# Patient Record
Sex: Male | Born: 1990 | Race: White | Hispanic: No | Marital: Married | Smoking: Former smoker
Health system: Southern US, Community
[De-identification: ages and names within clinical notes are randomized; demographics above are authoritative.]

## PROBLEM LIST (undated history)

## (undated) DIAGNOSIS — I10 Essential (primary) hypertension: Secondary | ICD-10-CM

## (undated) DIAGNOSIS — E785 Hyperlipidemia, unspecified: Secondary | ICD-10-CM

## (undated) HISTORY — DX: Essential (primary) hypertension: I10

## (undated) HISTORY — DX: Hyperlipidemia, unspecified: E78.5

---

## 2017-02-15 ENCOUNTER — Ambulatory Visit (INDEPENDENT_AMBULATORY_CARE_PROVIDER_SITE_OTHER): Payer: 59 | Admitting: Family Medicine

## 2017-02-15 ENCOUNTER — Encounter: Payer: Self-pay | Admitting: Family Medicine

## 2017-02-15 VITALS — BP 126/70 | HR 81 | Resp 12 | Ht 68.0 in | Wt 273.2 lb

## 2017-02-15 DIAGNOSIS — E781 Pure hyperglyceridemia: Secondary | ICD-10-CM

## 2017-02-15 DIAGNOSIS — I10 Essential (primary) hypertension: Secondary | ICD-10-CM

## 2017-02-15 DIAGNOSIS — Z6841 Body Mass Index (BMI) 40.0 and over, adult: Secondary | ICD-10-CM | POA: Diagnosis not present

## 2017-02-15 DIAGNOSIS — Z23 Encounter for immunization: Secondary | ICD-10-CM | POA: Diagnosis not present

## 2017-02-15 NOTE — Patient Instructions (Addendum)
A few things to remember from today's visit:   Hypertension, essential, benign - Plan: losartan (COZAAR) 100 MG tablet  Hypertriglyceridemia   DASH Eating Plan DASH stands for "Dietary Approaches to Stop Hypertension." The DASH eating plan is a healthy eating plan that has been shown to reduce high blood pressure (hypertension). It may also reduce your risk for type 2 diabetes, heart disease, and stroke. The DASH eating plan may also help with weight loss. What are tips for following this plan? General guidelines  Avoid eating more than 2,300 mg (milligrams) of salt (sodium) a day. If you have hypertension, you may need to reduce your sodium intake to 1,500 mg a day.  Limit alcohol intake to no more than 1 drink a day for nonpregnant women and 2 drinks a day for men. One drink equals 12 oz of beer, 5 oz of wine, or 1 oz of hard liquor.  Work with your health care provider to maintain a healthy body weight or to lose weight. Ask what an ideal weight is for you.  Get at least 30 minutes of exercise that causes your heart to beat faster (aerobic exercise) most days of the week. Activities may include walking, swimming, or biking.  Work with your health care provider or diet and nutrition specialist (dietitian) to adjust your eating plan to your individual calorie needs. Reading food labels  Check food labels for the amount of sodium per serving. Choose foods with less than 5 percent of the Daily Value of sodium. Generally, foods with less than 300 mg of sodium per serving fit into this eating plan.  To find whole grains, look for the word "whole" as the first word in the ingredient list. Shopping  Buy products labeled as "low-sodium" or "no salt added."  Buy fresh foods. Avoid canned foods and premade or frozen meals. Cooking  Avoid adding salt when cooking. Use salt-free seasonings or herbs instead of table salt or sea salt. Check with your health care provider or pharmacist before  using salt substitutes.  Do not fry foods. Cook foods using healthy methods such as baking, boiling, grilling, and broiling instead.  Cook with heart-healthy oils, such as olive, canola, soybean, or sunflower oil. Meal planning   Eat a balanced diet that includes: ? 5 or more servings of fruits and vegetables each day. At each meal, try to fill half of your plate with fruits and vegetables. ? Up to 6-8 servings of whole grains each day. ? Less than 6 oz of lean meat, poultry, or fish each day. A 3-oz serving of meat is about the same size as a deck of cards. One egg equals 1 oz. ? 2 servings of low-fat dairy each day. ? A serving of nuts, seeds, or beans 5 times each week. ? Heart-healthy fats. Healthy fats called Omega-3 fatty acids are found in foods such as flaxseeds and coldwater fish, like sardines, salmon, and mackerel.  Limit how much you eat of the following: ? Canned or prepackaged foods. ? Food that is high in trans fat, such as fried foods. ? Food that is high in saturated fat, such as fatty meat. ? Sweets, desserts, sugary drinks, and other foods with added sugar. ? Full-fat dairy products.  Do not salt foods before eating.  Try to eat at least 2 vegetarian meals each week.  Eat more home-cooked food and less restaurant, buffet, and fast food.  When eating at a restaurant, ask that your food be prepared with less salt  or no salt, if possible. What foods are recommended? The items listed may not be a complete list. Talk with your dietitian about what dietary choices are best for you. Grains Whole-grain or whole-wheat bread. Whole-grain or whole-wheat pasta. Brown rice. Orpah Cobb. Bulgur. Whole-grain and low-sodium cereals. Pita bread. Low-fat, low-sodium crackers. Whole-wheat flour tortillas. Vegetables Fresh or frozen vegetables (raw, steamed, roasted, or grilled). Low-sodium or reduced-sodium tomato and vegetable juice. Low-sodium or reduced-sodium tomato sauce  and tomato paste. Low-sodium or reduced-sodium canned vegetables. Fruits All fresh, dried, or frozen fruit. Canned fruit in natural juice (without added sugar). Meat and other protein foods Skinless chicken or [place]. Ground chicken or [place]. Pork with fat trimmed off. Fish and seafood. Egg whites. Dried beans, peas, or lentils. Unsalted nuts, nut butters, and seeds. Unsalted canned beans. Lean cuts of beef with fat trimmed off. Low-sodium, lean deli meat. Dairy Low-fat (1%) or fat-free (skim) milk. Fat-free, low-fat, or reduced-fat cheeses. Nonfat, low-sodium ricotta or cottage cheese. Low-fat or nonfat yogurt. Low-fat, low-sodium cheese. Fats and oils Soft margarine without trans fats. Vegetable oil. Low-fat, reduced-fat, or light mayonnaise and salad dressings (reduced-sodium). Canola, safflower, olive, soybean, and sunflower oils. Avocado. Seasoning and other foods Herbs. Spices. Seasoning mixes without salt. Unsalted popcorn and pretzels. Fat-free sweets. What foods are not recommended? The items listed may not be a complete list. Talk with your dietitian about what dietary choices are best for you. Grains Baked goods made with fat, such as croissants, muffins, or some breads. Dry pasta or rice meal packs. Vegetables Creamed or fried vegetables. Vegetables in a cheese sauce. Regular canned vegetables (not low-sodium or reduced-sodium). Regular canned tomato sauce and paste (not low-sodium or reduced-sodium). Regular tomato and vegetable juice (not low-sodium or reduced-sodium). Rosita Fire. Olives. Fruits Canned fruit in a light or heavy syrup. Fried fruit. Fruit in cream or butter sauce. Meat and other protein foods Fatty cuts of meat. Ribs. Fried meat. Tomasa Blase. Sausage. Bologna and other processed lunch meats. Salami. Fatback. Hotdogs. Bratwurst. Salted nuts and seeds. Canned beans with added salt. Canned or smoked fish. Whole eggs or egg yolks. Chicken or [place] with skin. Dairy Whole or 2%  milk, cream, and half-and-half. Whole or full-fat cream cheese. Whole-fat or sweetened yogurt. Full-fat cheese. Nondairy creamers. Whipped toppings. Processed cheese and cheese spreads. Fats and oils Butter. Stick margarine. Lard. Shortening. Ghee. Bacon fat. Tropical oils, such as coconut, palm kernel, or palm oil. Seasoning and other foods Salted popcorn and pretzels. Onion salt, garlic salt, seasoned salt, table salt, and sea salt. Worcestershire sauce. Tartar sauce. Barbecue sauce. Teriyaki sauce. Soy sauce, including reduced-sodium. Steak sauce. Canned and packaged gravies. Fish sauce. Oyster sauce. Cocktail sauce. Horseradish that you find on the shelf. Ketchup. Mustard. Meat flavorings and tenderizers. Bouillon cubes. Hot sauce and Tabasco sauce. Premade or packaged marinades. Premade or packaged taco seasonings. Relishes. Regular salad dressings. Where to find more information:  National Heart, Lung, and Blood Institute: PopSteam.is  American Heart Association: www.heart.org Summary  The DASH eating plan is a healthy eating plan that has been shown to reduce high blood pressure (hypertension). It may also reduce your risk for type 2 diabetes, heart disease, and stroke.  With the DASH eating plan, you should limit salt (sodium) intake to 2,300 mg a day. If you have hypertension, you may need to reduce your sodium intake to 1,500 mg a day.  When on the DASH eating plan, aim to eat more fresh fruits and vegetables, whole grains, lean proteins, low-fat dairy,  and heart-healthy fats.  Work with your health care provider or diet and nutrition specialist (dietitian) to adjust your eating plan to your individual calorie needs. This information is not intended to replace advice given to you by your health care provider. Make sure you discuss any questions you have with your health care provider. Document Released: 04/08/2011 Document Revised: 04/12/2016 Document Reviewed:  04/12/2016 Elsevier Interactive Patient Education  2017 [employer].  Please be sure medication list is accurate. If a new problem present, please set up appointment sooner than planned today.

## 2017-02-15 NOTE — Progress Notes (Signed)
HPI:   Brad Hart is a 26 y.o. male, who is here today to establish care.  Former PCP: Dr Burnett Sheng Last preventive routine visit: 09/01/16  Chronic medical problems: HTN,HLD,obesity   HLD:  FLP in 08/2016: TC 196, TG 227,HDL 42.6, and LDL 108. He is not on pharmacologic treatment.  He has not been consistent with a low fat diet. He drinks a couple beers per week. Former smoker.  He does exercise regularly but acknowledges that his problem is his diet.  TSH 1.219 HgA1C 4.9   HTN: Dx early this year. He is on Losartan 100 mg daily. Denies severe/frequent headache, visual changes, chest pain, dyspnea, palpitation, claudication, focal weakness, or edema.  He does not follow a low salt diet. 08/2016: Cr 1.2, e GFR 73, K+ 3.9.  LFT normal.    Review of Systems  Constitutional: Negative for activity change, appetite change, fatigue, fever and unexpected weight change.  HENT: Negative for nosebleeds, sore throat and trouble swallowing.   Eyes: Negative for redness and visual disturbance.  Respiratory: Negative for apnea, cough, shortness of breath and wheezing.   Cardiovascular: Negative for chest pain, palpitations and leg swelling.  Gastrointestinal: Negative for abdominal pain, nausea and vomiting.  Endocrine: Negative for cold intolerance, heat intolerance, polydipsia, polyphagia and polyuria.  Genitourinary: Negative for decreased urine volume, dysuria and hematuria.  Musculoskeletal: Negative for gait problem and myalgias.  Skin: Negative for pallor and rash.  Neurological: Negative for dizziness, seizures, weakness, numbness and headaches.  Psychiatric/Behavioral: Negative for confusion. The patient is not nervous/anxious.       No current outpatient prescriptions on file prior to visit.   No current facility-administered medications on file prior to visit.      Past Medical History:  Diagnosis Date  . Hyperlipidemia   . Hypertension     No Known Allergies  Family History  Problem Relation Age of Onset  . Adopted: Yes  . Hemachromatosis Maternal Grandmother   . Heart disease Maternal Grandfather        CAD/MI    Social History   Social History  . Marital status: Married    Spouse name: N/A  . Number of children: N/A  . Years of education: N/A   Social History Main Topics  . Smoking status: Former Smoker    Quit date: 09/15/2016  . Smokeless tobacco: Never Used  . Alcohol use Yes     Comment: OCCASIONAL  . Drug use: No  . Sexual activity: Yes   Other Topics Concern  . None   Social History Narrative  . None    Vitals:   02/15/17 1509  BP: 126/70  Pulse: 81  Resp: 12  SpO2: 98%    Body mass index is 41.55 kg/m.   Physical Exam  Nursing note and vitals reviewed. Constitutional: He is oriented to person, place, and time. He appears well-developed. No distress.  HENT:  Head: Normocephalic and atraumatic.  Mouth/Throat: Oropharynx is clear and moist and mucous membranes are normal.  Eyes: Pupils are equal, round, and reactive to light. Conjunctivae are normal.  Neck: No tracheal deviation present. No thyroid mass and no thyromegaly present.  Cardiovascular: Normal rate and regular rhythm.   No murmur heard. Pulses:      Dorsalis pedis pulses are 2+ on the right side, and 2+ on the left side.  Respiratory: Effort normal and breath sounds normal. No respiratory distress.  GI: Soft. He exhibits no mass. There is no  hepatomegaly. There is no tenderness.  Musculoskeletal: He exhibits no edema.  Lymphadenopathy:    He has no cervical adenopathy.  Neurological: He is alert and oriented to person, place, and time. He has normal strength. Gait normal.  Skin: Skin is warm. No rash noted. No erythema.  Psychiatric: He has a normal mood and affect. Cognition and memory are normal.  Well groomed, good eye contact.    ASSESSMENT AND PLAN:  Mr. Nelvin was seen today for establish care.  Diagnoses  and all orders for this visit:  Hypertension, essential, benign  Adequately controlled. No changes in current management. DASH diet recommended. Eye exam recommended annually. F/U in 6 months, before if needed.  Hypertriglyceridemia  Mild. Low fat diet discussed and recommended. Decrease beer intake. F/U in 08/2017.  Morbid obesity with BMI of 40.0-44.9, adult (HCC)  We discussed benefits of wt loss as well as adverse effects of obesity. Consistency with healthy diet and physical activity recommended.  Need for influenza vaccination -     Flu Vaccine QUAD 36+ mos IM        Betty G. [place], MD  [facility]. [place] office.

## 2017-02-20 ENCOUNTER — Encounter: Payer: Self-pay | Admitting: Family Medicine

## 2017-03-29 ENCOUNTER — Telehealth: Payer: Self-pay | Admitting: Family Medicine

## 2017-03-29 DIAGNOSIS — I10 Essential (primary) hypertension: Secondary | ICD-10-CM

## 2017-03-29 MED ORDER — LOSARTAN POTASSIUM 100 MG PO TABS: 100.0000 mg | ORAL_TABLET | Freq: Every day | ORAL | 0 refills | Status: DC

## 2017-03-29 NOTE — Telephone Encounter (Signed)
Copied from CRM #[id number]. Topic: General - Other >> Mar 29, 2017 11:55 AM Cecelia ByarsGreen, Temeka L, RMA wrote: Reason for CRM: patient is requesting refill for losartan 100 mg to be sent to [facility]

## 2017-03-30 MED FILL — LOSARTAN POTASSIUM 100 MG T: 100 | 90 days supply | Qty: 90 | Fill #0

## 2017-07-24 NOTE — Progress Notes (Signed)
HPI:   Brad Hart is a 27 y.o. male, who is here today for 5 months follow up.   He was last seen in 01/2017.  Hypertension:   Currently on Losartan 50 mg daily.  Home BP's 160's/50-60's.  Last eye exam: never. He is taking medications as instructed, no side effects reported.  He has not noted unusual headache, visual changes, exertional chest pain, dyspnea,  focal weakness, or edema.   Hyperlipidemia:  Currently on non pharmacologic treatment. Following a low fat diet: Not consistently. Last FLP in 08/2016, TG 227.     He has not been consistent with a healthy diet and is not exercising. He has noted wt gain.     Review of Systems  Constitutional: Negative for activity change, appetite change, fatigue and fever.  HENT: Negative for nosebleeds, sore throat and trouble swallowing.   Eyes: Negative for redness and visual disturbance.  Respiratory: Negative for cough, shortness of breath and wheezing.   Cardiovascular: Negative for chest pain, palpitations and leg swelling.  Gastrointestinal: Negative for abdominal pain, nausea and vomiting.  Endocrine: Negative for polydipsia, polyphagia and polyuria.  Genitourinary: Negative for decreased urine volume, dysuria and hematuria.  Skin: Negative for rash.  Neurological: Negative for syncope, weakness and headaches.      No current outpatient medications on file prior to visit.   No current facility-administered medications on file prior to visit.      Past Medical History:  Diagnosis Date  . Hyperlipidemia   . Hypertension    No Known Allergies  Social History   Socioeconomic History  . Marital status: Married    Spouse name: Not on file  . Number of children: Not on file  . Years of education: Not on file  . Highest education level: Not on file  Occupational History  . Not on file  Social Needs  . Financial resource strain: Not on file  . Food insecurity:    Worry: Not on file   Inability: Not on file  . Transportation needs:    Medical: Not on file    Non-medical: Not on file  Tobacco Use  . Smoking status: Former Smoker    Last attempt to quit: 09/15/2016    Years since quitting: 0.8  . Smokeless tobacco: Never Used  Substance and Sexual Activity  . Alcohol use: Yes    Comment: OCCASIONAL  . Drug use: No  . Sexual activity: Yes  Lifestyle  . Physical activity:    Days per week: Not on file    Minutes per session: Not on file  . Stress: Not on file  Relationships  . Social connections:    Talks on phone: Not on file    Gets together: Not on file    Attends religious service: Not on file    Active member of club or organization: Not on file    Attends meetings of clubs or organizations: Not on file    Relationship status: Not on file  Other Topics Concern  . Not on file  Social History Narrative  . Not on file    Vitals:   07/25/17 1453 07/25/17 1508  BP: 130/83 (!) 160/85  Pulse: 96   Resp: 12   Temp: 98.3 F (36.8 C)   SpO2: 97%    Body mass index is 42.76 kg/m.  Wt Readings from Last 3 Encounters:  07/25/17 281 lb 4 oz (127.6 kg)  02/15/17 273 lb 4 oz (123.9 kg)  Physical Exam  Nursing note and vitals reviewed. Constitutional: He is oriented to person, place, and time. He appears well-developed. No distress.  HENT:  Head: Normocephalic and atraumatic.  Mouth/Throat: Oropharynx is clear and moist and mucous membranes are normal.  Eyes: Pupils are equal, round, and reactive to light. Conjunctivae and EOM are normal.  Cardiovascular: Normal rate and regular rhythm.  No murmur heard. Pulses:      Dorsalis pedis pulses are 2+ on the right side, and 2+ on the left side.  Respiratory: Effort normal and breath sounds normal. No respiratory distress.  GI: Soft. He exhibits no mass. There is no hepatomegaly. There is no tenderness.  Musculoskeletal: He exhibits no edema.  Lymphadenopathy:    He has no cervical adenopathy.    Neurological: He is alert and oriented to person, place, and time. He has normal strength. Gait normal.  Skin: Skin is warm. No erythema.  Psychiatric: He has a normal mood and affect. Cognition and memory are normal.  Well groomed, good eye contact.      ASSESSMENT AND PLAN:   Mr. Brad Hart was seen today for 5 months follow-up.  Orders Placed This Encounter  Procedures  . Basic metabolic panel  . Lipid panel    Hypertension, essential, benign Not well controlled. Possible complications of elevated BP discussed. HCTZ 25 mg added today. No changes in Losartan 100 mg daily. Annual eye examination. BP check in 4 weeks and f/u in 3-4 months.   Hypertriglyceridemia Nonpharmacologic treatment recommended for now. He will be back in 4 weeks for fasting labs, recommendations will be given accordingly.  Morbid obesity with BMI of 40.0-44.9, adult (HCC) Gained about 8 Lb since his last visit in 01/2017. We discussed benefits of wt loss as well as adverse effects of obesity. Consistency with healthy diet and physical activity recommended.       -Mr. Brad Hart was advised to return sooner than planned today if new concerns arise.       Betty G. [place], MD  [facility]. [place] office.

## 2017-07-25 ENCOUNTER — Encounter: Payer: Self-pay | Admitting: Family Medicine

## 2017-07-25 ENCOUNTER — Ambulatory Visit (INDEPENDENT_AMBULATORY_CARE_PROVIDER_SITE_OTHER): Payer: 59 | Admitting: Family Medicine

## 2017-07-25 VITALS — BP 160/85 | HR 96 | Temp 98.3°F | Resp 12 | Ht 68.0 in | Wt 281.2 lb

## 2017-07-25 DIAGNOSIS — I10 Essential (primary) hypertension: Secondary | ICD-10-CM | POA: Diagnosis not present

## 2017-07-25 DIAGNOSIS — E781 Pure hyperglyceridemia: Secondary | ICD-10-CM

## 2017-07-25 DIAGNOSIS — Z6841 Body Mass Index (BMI) 40.0 and over, adult: Secondary | ICD-10-CM | POA: Diagnosis not present

## 2017-07-25 MED ORDER — LOSARTAN POTASSIUM 100 MG PO TABS: 100.0000 mg | ORAL_TABLET | Freq: Every day | ORAL | 1 refills | Status: DC

## 2017-07-25 MED ORDER — HYDROCHLOROTHIAZIDE 25 MG PO TABS: 25.0000 mg | ORAL_TABLET | Freq: Every day | ORAL | 0 refills | Status: DC

## 2017-07-25 MED FILL — HYDROCHLOROTHIAZIDE 25 MG T: 25 | 90 days supply | Qty: 90 | Fill #0

## 2017-07-25 MED FILL — LOSARTAN POTASSIUM 100 MG T: 100 | 90 days supply | Qty: 90 | Fill #0

## 2017-07-25 NOTE — Assessment & Plan Note (Signed)
Gained about 8 Lb since his last visit in 01/2017. We discussed benefits of wt loss as well as adverse effects of obesity. Consistency with healthy diet and physical activity recommended.

## 2017-07-25 NOTE — Patient Instructions (Addendum)
A few things to remember from today's visit:   Hypertension, essential, benign - Plan: hydrochlorothiazide (HYDRODIURIL) 25 MG tablet, Basic metabolic panel, losartan (COZAAR) 100 MG tablet  Hypertriglyceridemia - Plan: Lipid panel  DASH Eating Plan DASH stands for "Dietary Approaches to Stop Hypertension." The DASH eating plan is a healthy eating plan that has been shown to reduce high blood pressure (hypertension). It may also reduce your risk for type 2 diabetes, heart disease, and stroke. The DASH eating plan may also help with weight loss. What are tips for following this plan? General guidelines  Avoid eating more than 2,300 mg (milligrams) of salt (sodium) a day. If you have hypertension, you may need to reduce your sodium intake to 1,500 mg a day.  Limit alcohol intake to no more than 1 drink a day for nonpregnant women and 2 drinks a day for men. One drink equals 12 oz of beer, 5 oz of wine, or 1 oz of hard liquor.  Work with your health care provider to maintain a healthy body weight or to lose weight. Ask what an ideal weight is for you.  Get at least 30 minutes of exercise that causes your heart to beat faster (aerobic exercise) most days of the week. Activities may include walking, swimming, or biking.  Work with your health care provider or diet and nutrition specialist (dietitian) to adjust your eating plan to your individual calorie needs. Reading food labels  Check food labels for the amount of sodium per serving. Choose foods with less than 5 percent of the Daily Value of sodium. Generally, foods with less than 300 mg of sodium per serving fit into this eating plan.  To find whole grains, look for the word "whole" as the first word in the ingredient list. Shopping  Buy products labeled as "low-sodium" or "no salt added."  Buy fresh foods. Avoid canned foods and premade or frozen meals. Cooking  Avoid adding salt when cooking. Use salt-free seasonings or herbs  instead of table salt or sea salt. Check with your health care provider or pharmacist before using salt substitutes.  Do not fry foods. Cook foods using healthy methods such as baking, boiling, grilling, and broiling instead.  Cook with heart-healthy oils, such as olive, canola, soybean, or sunflower oil. Meal planning   Eat a balanced diet that includes: ? 5 or more servings of fruits and vegetables each day. At each meal, try to fill half of your plate with fruits and vegetables. ? Up to 6-8 servings of whole grains each day. ? Less than 6 oz of lean meat, poultry, or fish each day. A 3-oz serving of meat is about the same size as a deck of cards. One egg equals 1 oz. ? 2 servings of low-fat dairy each day. ? A serving of nuts, seeds, or beans 5 times each week. ? Heart-healthy fats. Healthy fats called Omega-3 fatty acids are found in foods such as flaxseeds and coldwater fish, like sardines, salmon, and mackerel.  Limit how much you eat of the following: ? Canned or prepackaged foods. ? Food that is high in trans fat, such as fried foods. ? Food that is high in saturated fat, such as fatty meat. ? Sweets, desserts, sugary drinks, and other foods with added sugar. ? Full-fat dairy products.  Do not salt foods before eating.  Try to eat at least 2 vegetarian meals each week.  Eat more home-cooked food and less restaurant, buffet, and fast food.  When eating at  a restaurant, ask that your food be prepared with less salt or no salt, if possible. What foods are recommended? The items listed may not be a complete list. Talk with your dietitian about what dietary choices are best for you. Grains Whole-grain or whole-wheat bread. Whole-grain or whole-wheat pasta. Brown rice. Orpah Cobbatmeal. Quinoa. Bulgur. Whole-grain and low-sodium cereals. Pita bread. Low-fat, low-sodium crackers. Whole-wheat flour tortillas. Vegetables Fresh or frozen vegetables (raw, steamed, roasted, or grilled).  Low-sodium or reduced-sodium tomato and vegetable juice. Low-sodium or reduced-sodium tomato sauce and tomato paste. Low-sodium or reduced-sodium canned vegetables. Fruits All fresh, dried, or frozen fruit. Canned fruit in natural juice (without added sugar). Meat and other protein foods Skinless chicken or Malawiturkey. Ground chicken or Malawiturkey. Pork with fat trimmed off. Fish and seafood. Egg whites. Dried beans, peas, or lentils. Unsalted nuts, nut butters, and seeds. Unsalted canned beans. Lean cuts of beef with fat trimmed off. Low-sodium, lean deli meat. Dairy Low-fat (1%) or fat-free (skim) milk. Fat-free, low-fat, or reduced-fat cheeses. Nonfat, low-sodium ricotta or cottage cheese. Low-fat or nonfat yogurt. Low-fat, low-sodium cheese. Fats and oils Soft margarine without trans fats. Vegetable oil. Low-fat, reduced-fat, or light mayonnaise and salad dressings (reduced-sodium). Canola, safflower, olive, soybean, and sunflower oils. Avocado. Seasoning and other foods Herbs. Spices. Seasoning mixes without salt. Unsalted popcorn and pretzels. Fat-free sweets. What foods are not recommended? The items listed may not be a complete list. Talk with your dietitian about what dietary choices are best for you. Grains Baked goods made with fat, such as croissants, muffins, or some breads. Dry pasta or rice meal packs. Vegetables Creamed or fried vegetables. Vegetables in a cheese sauce. Regular canned vegetables (not low-sodium or reduced-sodium). Regular canned tomato sauce and paste (not low-sodium or reduced-sodium). Regular tomato and vegetable juice (not low-sodium or reduced-sodium). Rosita FirePickles. Olives. Fruits Canned fruit in a light or heavy syrup. Fried fruit. Fruit in cream or butter sauce. Meat and other protein foods Fatty cuts of meat. Ribs. Fried meat. Tomasa BlaseBacon. Sausage. Bologna and other processed lunch meats. Salami. Fatback. Hotdogs. Bratwurst. Salted nuts and seeds. Canned beans with added  salt. Canned or smoked fish. Whole eggs or egg yolks. Chicken or Malawiturkey with skin. Dairy Whole or 2% milk, cream, and half-and-half. Whole or full-fat cream cheese. Whole-fat or sweetened yogurt. Full-fat cheese. Nondairy creamers. Whipped toppings. Processed cheese and cheese spreads. Fats and oils Butter. Stick margarine. Lard. Shortening. Ghee. Bacon fat. Tropical oils, such as coconut, palm kernel, or palm oil. Seasoning and other foods Salted popcorn and pretzels. Onion salt, garlic salt, seasoned salt, table salt, and sea salt. Worcestershire sauce. Tartar sauce. Barbecue sauce. Teriyaki sauce. Soy sauce, including reduced-sodium. Steak sauce. Canned and packaged gravies. Fish sauce. Oyster sauce. Cocktail sauce. Horseradish that you find on the shelf. Ketchup. Mustard. Meat flavorings and tenderizers. Bouillon cubes. Hot sauce and Tabasco sauce. Premade or packaged marinades. Premade or packaged taco seasonings. Relishes. Regular salad dressings. Where to find more information:  National Heart, Lung, and Blood Institute: PopSteam.iswww.nhlbi.nih.gov  American Heart Association: www.heart.org Summary  The DASH eating plan is a healthy eating plan that has been shown to reduce high blood pressure (hypertension). It may also reduce your risk for type 2 diabetes, heart disease, and stroke.  With the DASH eating plan, you should limit salt (sodium) intake to 2,300 mg a day. If you have hypertension, you may need to reduce your sodium intake to 1,500 mg a day.  When on the DASH eating plan, aim to eat  more fresh fruits and vegetables, whole grains, lean proteins, low-fat dairy, and heart-healthy fats.  Work with your health care provider or diet and nutrition specialist (dietitian) to adjust your eating plan to your individual calorie needs. This information is not intended to replace advice given to you by your health care provider. Make sure you discuss any questions you have with your health care  provider. Document Released: 04/08/2011 Document Revised: 04/12/2016 Document Reviewed: 04/12/2016 Elsevier Interactive Patient Education  [employer].   Please be sure medication list is accurate. If a new problem present, please set up appointment sooner than planned today.

## 2017-07-25 NOTE — Assessment & Plan Note (Signed)
Not well controlled. Possible complications of elevated BP discussed. HCTZ 25 mg added today. No changes in Losartan 100 mg daily. Annual eye examination. BP check in 4 weeks and f/u in 3-4 months.

## 2017-07-25 NOTE — Assessment & Plan Note (Signed)
Nonpharmacologic treatment recommended for now. He will be back in 4 weeks for fasting labs, recommendations will be given accordingly.

## 2017-08-21 NOTE — Progress Notes (Signed)
Brad Hart is a 27 y.o.male, who is here today to follow on HTN.  Currently she is on Losartan 100 mg and HCTZ 25 mg, the latter one added last OV (07/25/17). He is taking medications as instructed, no side effects reported.  He has not noted unusual headache, visual changes, exertional chest pain, dyspnea,  focal weakness, or edema.  Home BP's 130's/80's.  He has also changed his diet.   Back pain: Last week he was lifting a heavy speaker that he thinks he pulled a muscle on his back, pain started next day. Achy pain, 6/10, constant. Exacerbated by stretching uncertain movements and also when laying in bed on his back. No numbness or tingling. No saddle anesthesia,bowel or urine incontinence.  No prior history of back pain. He has not try OTC medication.  HLD: Elevated TG. He has not been consistent with low fat diet.  Cholesterol, Total   196  Triglyceride   227  HDL  42.6   LDL  108     Review of Systems  Constitutional: Negative for activity change, appetite change, fatigue and fever.  HENT: Negative for nosebleeds, sore throat and trouble swallowing.   Eyes: Negative for redness and visual disturbance.  Respiratory: Negative for cough, shortness of breath and wheezing.   Cardiovascular: Negative for chest pain, palpitations and leg swelling.  Gastrointestinal: Negative for abdominal pain, nausea and vomiting.       No changes in bowel habits.  Genitourinary: Negative for decreased urine volume and hematuria.  Musculoskeletal: Positive for back pain. Negative for gait problem.  Skin: Negative for rash.  Neurological: Negative for syncope, weakness, numbness and headaches.     Current Outpatient Medications on File Prior to Visit  Medication Sig Dispense Refill  . hydrochlorothiazide (HYDRODIURIL) 25 MG tablet Take 1 tablet (25 mg total) by mouth daily. 90 tablet 0  . losartan (COZAAR) 100 MG tablet Take 1 tablet (100 mg total) by mouth daily. Take  1 tab daily 90 tablet 1   No current facility-administered medications on file prior to visit.      Past Medical History:  Diagnosis Date  . Hyperlipidemia   . Hypertension     No Known Allergies  Social History   Socioeconomic History  . Marital status: Married    Spouse name: Not on file  . Number of children: Not on file  . Years of education: Not on file  . Highest education level: Not on file  Occupational History  . Not on file  Social Needs  . Financial resource strain: Not on file  . Food insecurity:    Worry: Not on file    Inability: Not on file  . Transportation needs:    Medical: Not on file    Non-medical: Not on file  Tobacco Use  . Smoking status: Former Smoker    Last attempt to quit: 09/15/2016    Years since quitting: 0.9  . Smokeless tobacco: Never Used  Substance and Sexual Activity  . Alcohol use: Yes    Comment: OCCASIONAL  . Drug use: No  . Sexual activity: Yes  Lifestyle  . Physical activity:    Days per week: Not on file    Minutes per session: Not on file  . Stress: Not on file  Relationships  . Social connections:    Talks on phone: Not on file    Gets together: Not on file    Attends religious service: Not on file  Active member of club or organization: Not on file    Attends meetings of clubs or organizations: Not on file    Relationship status: Not on file  Other Topics Concern  . Not on file  Social History Narrative  . Not on file    Vitals:   08/22/17 0837  BP: 130/83  Pulse: 70  Resp: 12  Temp: 97.9 F (36.6 C)  SpO2: 96%   Body mass index is 42.5 kg/m.  Wt Readings from Last 3 Encounters:  08/22/17 279 lb 8 oz (126.8 kg)  07/25/17 281 lb 4 oz (127.6 kg)  02/15/17 273 lb 4 oz (123.9 kg)     Physical Exam  Nursing note and vitals reviewed. Constitutional: He is oriented to person, place, and time. He appears well-developed. No distress.  HENT:  Head: Normocephalic and atraumatic.  Mouth/Throat:  Oropharynx is clear and moist and mucous membranes are normal.  Eyes: Pupils are equal, round, and reactive to light. Conjunctivae are normal.  Cardiovascular: Normal rate and regular rhythm.  No murmur heard. Pulses:      Dorsalis pedis pulses are 2+ on the right side, and 2+ on the left side.  Respiratory: Effort normal and breath sounds normal. No respiratory distress.  GI: Soft. He exhibits no mass. There is no hepatomegaly. There is no tenderness.  Musculoskeletal: He exhibits no edema.       Thoracic back: He exhibits tenderness and spasm.       Back:  Tenderness upon palpation of right paraspinal muscles, lower throacic  Lymphadenopathy:    He has no cervical adenopathy.  Neurological: He is alert and oriented to person, place, and time. He has normal strength.  Skin: Skin is warm. No erythema.  Psychiatric: He has a normal mood and affect. Cognition and memory are normal.  Well groomed, good eye contact.    ASSESSMENT AND PLAN:   Domingo DimesDylan was seen today for follow-up.  Orders Placed This Encounter  Procedures  . LDL cholesterol, direct    Lab Results  Component Value Date   CHOL 172 08/22/2017   HDL 40.70 08/22/2017   LDLDIRECT 101.0 08/22/2017   TRIG 251.0 (H) 08/22/2017   CHOLHDL 4 08/22/2017    Hypertension, essential, benign Better controlled. No changes in current management. DASH diet recommended. Eye exam recommended annually. F/U in 6 months, before if needed.   Hypertriglyceridemia Continue low-fat diet for now. Further recommendation will be given according to the lipid panel results.  Acute right-sided thoracic back pain  Local ice or OTC icy hot with lidocaine may help. Side effects of Flexeril discussed. I do not think imaging is needed today. Instructed about warning signs.  -     cyclobenzaprine (FLEXERIL) 10 MG tablet; Take 1 tablet (10 mg total) by mouth at bedtime.    -Mr. Valetta CloseDylan Peter Calandra advised to return sooner than planned today  if new concerns arise.     Kastiel Simonian G. [place]azil[place]Jordan, MD  [facility]. [place] office.

## 2017-08-22 ENCOUNTER — Ambulatory Visit (INDEPENDENT_AMBULATORY_CARE_PROVIDER_SITE_OTHER): Payer: 59 | Admitting: Family Medicine

## 2017-08-22 ENCOUNTER — Encounter: Payer: Self-pay | Admitting: Family Medicine

## 2017-08-22 VITALS — BP 130/83 | HR 70 | Temp 97.9°F | Resp 12 | Ht 68.0 in | Wt 279.5 lb

## 2017-08-22 DIAGNOSIS — I10 Essential (primary) hypertension: Secondary | ICD-10-CM

## 2017-08-22 DIAGNOSIS — E781 Pure hyperglyceridemia: Secondary | ICD-10-CM

## 2017-08-22 DIAGNOSIS — M546 Pain in thoracic spine: Secondary | ICD-10-CM | POA: Diagnosis not present

## 2017-08-22 LAB — LIPID PANEL
CHOL/HDL RATIO: 4
Cholesterol: 172 mg/dL (ref 0–200)
HDL: 40.7 mg/dL (ref >39.00)
NONHDL: 131.76
Triglycerides: 251 mg/dL — ABNORMAL HIGH (ref 0.0–149.0)
VLDL: 50.2 mg/dL — ABNORMAL HIGH (ref 0.0–40.0)

## 2017-08-22 LAB — BASIC METABOLIC PANEL
BUN: 17 mg/dL (ref 6–23)
CO2: 29 mEq/L (ref 19–32)
Calcium: 9.3 mg/dL (ref 8.4–10.5)
Chloride: 101 mEq/L (ref 96–112)
Creatinine, Ser: 1.15 mg/dL (ref 0.40–1.50)
GFR: 80.13 mL/min (ref >60.00)
GLUCOSE: 92 mg/dL (ref 70–99)
Potassium: 3.9 mEq/L (ref 3.5–5.1)
SODIUM: 140 meq/L (ref 135–145)

## 2017-08-22 LAB — LDL CHOLESTEROL, DIRECT: LDL DIRECT: 101 mg/dL

## 2017-08-22 MED ORDER — CYCLOBENZAPRINE HCL 10 MG PO TABS: 10.0000 mg | ORAL_TABLET | Freq: Every day | ORAL | 0 refills | Status: AC

## 2017-08-22 MED FILL — CYCLOBENZAPRINE 10 MG TAB: 10 | 30 days supply | Qty: 30 | Fill #0

## 2017-08-22 NOTE — Assessment & Plan Note (Signed)
Continue low-fat diet for now. Further recommendation will be given according to the lipid panel results.

## 2017-08-22 NOTE — Patient Instructions (Signed)
A few things to remember from today's visit:   Hypertension, essential, benign - Plan: Basic metabolic panel  Acute right-sided thoracic back pain - Plan: cyclobenzaprine (FLEXERIL) 10 MG tablet  Hypertriglyceridemia - Plan: Lipid panel  No changes today.  Local icy hot with lidocaine may help with back pain. Flexeril at bedtime.   Please be sure medication list is accurate. If a new problem present, please set up appointment sooner than planned today.

## 2017-08-22 NOTE — Assessment & Plan Note (Signed)
Better controlled. No changes in current management. DASH diet recommended. Eye exam recommended annually. F/U in 6 months, before if needed.

## 2017-09-05 ENCOUNTER — Encounter: Payer: 59 | Admitting: Family Medicine

## 2017-11-11 ENCOUNTER — Telehealth: Payer: Self-pay | Admitting: Family Medicine

## 2017-11-11 ENCOUNTER — Other Ambulatory Visit: Payer: Self-pay | Admitting: Family Medicine

## 2017-11-11 DIAGNOSIS — I10 Essential (primary) hypertension: Secondary | ICD-10-CM

## 2017-11-11 MED ORDER — HYDROCHLOROTHIAZIDE 25 MG PO TABS: 25.0000 mg | ORAL_TABLET | Freq: Every day | ORAL | 3 refills | Status: DC

## 2017-11-11 MED FILL — LOSARTAN POTASSIUM 100 MG T: 100 | 90 days supply | Qty: 90 | Fill #1

## 2017-11-11 MED FILL — HYDROCHLOROTHIAZIDE 25 MG T: 25 | 90 days supply | Qty: 90 | Fill #0

## 2017-11-11 NOTE — Telephone Encounter (Unsigned)
Copied from CRM [phone]. Topic: Quick Communication - Rx Refill/Question >> Nov 11, 2017 12:55 PM Brad Hart, Brad G wrote: hydrochlorothiazide (HYDRODIURIL) 25 MG tablet  Needing refills  [facility] [place] - [address] [place] [id number] Phone: [phone] Fax: [phone]

## 2017-11-11 NOTE — Telephone Encounter (Signed)
Rx has been filled 

## 2018-02-15 ENCOUNTER — Other Ambulatory Visit: Payer: Self-pay | Admitting: Family Medicine

## 2018-02-15 DIAGNOSIS — I10 Essential (primary) hypertension: Secondary | ICD-10-CM

## 2018-02-15 MED ORDER — LOSARTAN POTASSIUM 100 MG PO TABS: 100.0000 mg | ORAL_TABLET | Freq: Every day | ORAL | 1 refills | Status: DC

## 2018-02-15 MED FILL — LOSARTAN POTASSIUM 100 MG T: 100 | 30 days supply | Qty: 30 | Fill #0

## 2018-02-15 NOTE — Telephone Encounter (Signed)
Re-routing. See below. Out of script.

## 2018-02-15 NOTE — Telephone Encounter (Signed)
Copied from CRM [id number]. Topic: Quick Communication - Rx Refill/Question >> Feb 15, 2018  3:37 PM Zada Girt, Washington L wrote: Medication: losartan (COZAAR) 100 MG tablet  Has the patient contacted their pharmacy? Yes.   (Agent: If no, request that the patient contact the pharmacy for the refill.) (Agent: If yes, when and what did the pharmacy advise?)  Preferred Pharmacy (with phone number or street name): [facility] - [place], [place] - [address] [place] [address] [place] [address] Phone: [phone] Fax: [phone]  Agent: Please be advised that RX refills may take up to 3 business days. We ask that you follow-up with your pharmacy.

## 2018-02-21 ENCOUNTER — Ambulatory Visit: Payer: 59 | Admitting: Family Medicine

## 2018-02-21 DIAGNOSIS — Z0289 Encounter for other administrative examinations: Secondary | ICD-10-CM

## 2018-02-21 NOTE — Progress Notes (Deleted)
HPI:   Brad Hart is a 27 y.o. male, who is here today for 6 months follow up.   *** was last seen on ***  Since *** last OV she has *** Obesity:  Dietary changes since last OV: *** Exercise: ***  Hypertension:    Currently on Cozaar 100 mg daily and HCTZ 25 mg daily.   ***taking medications as instructed, no side effects reported.  ***has not noted unusual headache, visual changes, exertional chest pain, dyspnea,  focal weakness, or edema.   Lab Results  Component Value Date   CREATININE 1.15 08/22/2017   BUN 17 08/22/2017   NA 140 08/22/2017   K 3.9 08/22/2017   CL 101 08/22/2017   CO2 29 08/22/2017       {4+ HPI elements (or status of 3 or more chronic diseases)} ***      Review of Systems  [review of 2 to 9 systems] ***  Current Outpatient Medications on File Prior to Visit  Medication Sig Dispense Refill  . hydrochlorothiazide (HYDRODIURIL) 25 MG tablet Take 1 tablet (25 mg total) by mouth daily. 90 tablet 3  . losartan (COZAAR) 100 MG tablet Take 1 tablet (100 mg total) by mouth daily. Take 1 tab daily 90 tablet 1   No current facility-administered medications on file prior to visit.      Past Medical History:  Diagnosis Date  . Hyperlipidemia   . Hypertension    No Known Allergies  Social History   Socioeconomic History  . Marital status: Married    Spouse name: Not on file  . Number of children: Not on file  . Years of education: Not on file  . Highest education level: Not on file  Occupational History  . Not on file  Social Needs  . Financial resource strain: Not on file  . Food insecurity:    Worry: Not on file    Inability: Not on file  . Transportation needs:    Medical: Not on file    Non-medical: Not on file  Tobacco Use  . Smoking status: Former Smoker    Last attempt to quit: 09/15/2016    Years since quitting: 1.4  . Smokeless tobacco: Never Used  Substance and Sexual Activity  . Alcohol use: Yes     Comment: OCCASIONAL  . Drug use: No  . Sexual activity: Yes  Lifestyle  . Physical activity:    Days per week: Not on file    Minutes per session: Not on file  . Stress: Not on file  Relationships  . Social connections:    Talks on phone: Not on file    Gets together: Not on file    Attends religious service: Not on file    Active member of club or organization: Not on file    Attends meetings of clubs or organizations: Not on file    Relationship status: Not on file  Other Topics Concern  . Not on file  Social History Narrative  . Not on file    There were no vitals filed for this visit. There is no height or weight on file to calculate BMI.   Wt Readings from Last 3 Encounters:  08/22/17 279 lb 8 oz (126.8 kg)  07/25/17 281 lb 4 oz (127.6 kg)  02/15/17 273 lb 4 oz (123.9 kg)      Physical Exam  {[12+ exam elements]} ***  ASSESSMENT AND PLAN:   Brad Hart was  seen today for *** months follow-up.  No orders of the defined types were placed in this encounter.   No problem-specific Assessment & Plan notes found for this encounter.           -Brad Hart was advised to return sooner than planned today if new concerns arise.       Betty G. [place], MD  [facility] [place] [facility]. [place] office.

## 2018-02-27 MED FILL — HYDROCHLOROTHIAZIDE 25 MG T: 25 | 90 days supply | Qty: 90 | Fill #1

## 2018-03-28 MED FILL — LOSARTAN POTASSIUM 100 MG T: 100 | 30 days supply | Qty: 30 | Fill #1

## 2018-05-02 MED FILL — LOSARTAN POTASSIUM 100 MG T: 100 | 30 days supply | Qty: 30 | Fill #2

## 2018-06-02 MED FILL — LOSARTAN POTASSIUM 100 MG T: 100 | 30 days supply | Qty: 30 | Fill #3

## 2018-06-13 MED FILL — HYDROCHLOROTHIAZIDE 25 MG T: 25 | 90 days supply | Qty: 90 | Fill #2

## 2018-07-13 MED FILL — LOSARTAN POTASSIUM 100 MG T: 100 | 30 days supply | Qty: 30 | Fill #4

## 2018-08-17 MED FILL — LOSARTAN POTASSIUM 100 MG T: 100 | 30 days supply | Qty: 30 | Fill #5

## 2018-09-18 ENCOUNTER — Telehealth: Payer: Self-pay | Admitting: *Deleted

## 2018-09-18 NOTE — Telephone Encounter (Signed)
Pt would like TOC to Dr. Salomon Fick? Is this okay?  Copied from CRM [id number]. Topic: Appointment Scheduling - Transfer of Care >> Sep 18, 2018 10:18 AM Fanny Bien wrote: Pt is requesting to transfer FROM: Betty Swaziland  Pt is requesting to transfer TO: Abbe Amsterdam  Reason for requested transfer: did not click.   Send CRM to patient's current PCP (transferring FROM).

## 2018-09-20 NOTE — Telephone Encounter (Signed)
Dr. Salomon Fick pt would like to toc to you. Please advise

## 2018-09-20 NOTE — Telephone Encounter (Signed)
It is Ok to transferee. THanks

## 2018-09-21 ENCOUNTER — Telehealth: Payer: Self-pay | Admitting: Family Medicine

## 2018-09-21 NOTE — Telephone Encounter (Unsigned)
Copied from CRM [id number]. Topic: Quick Communication - Rx Refill/Question >> Sep 21, 2018  3:25 PM Mcneil, Ja-Kwan wrote: Medication: hydrochlorothiazide (HYDRODIURIL) 25 MG tablet and losartan (COZAAR) 100 MG tablet  Has the patient contacted their pharmacy? no  Preferred Pharmacy (with phone number or street name): [facility] - [place], [place] - [address] [place] [phone] (Phone) [phone] (Fax)  Agent: Please be advised that RX refills may take up to 3 business days. We ask that you follow-up with your pharmacy.

## 2018-09-22 ENCOUNTER — Other Ambulatory Visit: Payer: Self-pay | Admitting: *Deleted

## 2018-09-22 DIAGNOSIS — I10 Essential (primary) hypertension: Secondary | ICD-10-CM

## 2018-09-22 MED ORDER — HYDROCHLOROTHIAZIDE 25 MG PO TABS: 25.0000 mg | ORAL_TABLET | Freq: Every day | ORAL | 0 refills | Status: DC

## 2018-09-22 MED ORDER — LOSARTAN POTASSIUM 100 MG PO TABS: 100.0000 mg | ORAL_TABLET | Freq: Every day | ORAL | 0 refills | Status: DC

## 2018-09-22 MED FILL — HYDROCHLOROTHIAZIDE 25 MG T: 25 | 30 days supply | Qty: 30 | Fill #0

## 2018-09-22 MED FILL — LOSARTAN POTASSIUM 100 MG T: 100 | 30 days supply | Qty: 30 | Fill #0

## 2018-09-22 NOTE — Telephone Encounter (Signed)
30 day supply sent to the pharmacy. Patient is wanting to transfer care to Dr. Salomon Fick.

## 2018-09-22 NOTE — Telephone Encounter (Signed)
Pt would like a TOC from Dr. Swaziland TO Dr. Salomon Fick. Ok per both providers see below.

## 2018-09-22 NOTE — Telephone Encounter (Signed)
That's fine

## 2018-09-26 NOTE — Telephone Encounter (Signed)
Called pt and lmom for pt to call the office back to discuss the [facility] and to get him scheduled.

## 2018-10-27 ENCOUNTER — Telehealth: Payer: Self-pay | Admitting: Family Medicine

## 2018-10-27 NOTE — Telephone Encounter (Signed)
Medication: losartan (COZAAR) 100 MG tablet [[id number]],  hydrochlorothiazide (HYDRODIURIL) 25 MG tablet [[id number]]   Has the patient contacted their pharmacy? Yes  (Agent: If no, request that the patient contact the pharmacy for the refill.) (Agent: If yes, when and what did the pharmacy advise?)  Preferred Pharmacy (with phone number or street name): [place], [place] - [place] [phone] (Phone) [phone] (Fax)    Agent: Please be advised that RX refills may take up to 3 business days. We ask that you follow-up with your pharmacy.

## 2018-10-30 ENCOUNTER — Other Ambulatory Visit: Payer: Self-pay | Admitting: *Deleted

## 2018-10-30 DIAGNOSIS — I10 Essential (primary) hypertension: Secondary | ICD-10-CM

## 2018-10-30 MED ORDER — LOSARTAN POTASSIUM 100 MG PO TABS: 100.0000 mg | ORAL_TABLET | Freq: Every day | ORAL | 0 refills | Status: DC

## 2018-10-30 MED ORDER — HYDROCHLOROTHIAZIDE 25 MG PO TABS: 25.0000 mg | ORAL_TABLET | Freq: Every day | ORAL | 0 refills | Status: DC

## 2018-10-30 MED FILL — HYDROCHLOROTHIAZIDE 25 MG T: 25 | 10 days supply | Qty: 10 | Fill #0

## 2018-10-30 MED FILL — LOSARTAN POTASSIUM 100 MG T: 100 | 10 days supply | Qty: 10 | Fill #0

## 2018-10-30 NOTE — Telephone Encounter (Signed)
10 day supply sent to pharmacy to cover until [facility] appointment with Dr. Volanda Napoleon.

## 2018-11-06 ENCOUNTER — Encounter: Payer: Self-pay | Admitting: Family Medicine

## 2018-11-06 ENCOUNTER — Other Ambulatory Visit: Payer: Self-pay

## 2018-11-06 ENCOUNTER — Ambulatory Visit (INDEPENDENT_AMBULATORY_CARE_PROVIDER_SITE_OTHER): Payer: No Typology Code available for payment source | Admitting: Family Medicine

## 2018-11-06 DIAGNOSIS — Z3009 Encounter for other general counseling and advice on contraception: Secondary | ICD-10-CM

## 2018-11-06 DIAGNOSIS — I10 Essential (primary) hypertension: Secondary | ICD-10-CM | POA: Diagnosis not present

## 2018-11-06 DIAGNOSIS — G8929 Other chronic pain: Secondary | ICD-10-CM

## 2018-11-06 DIAGNOSIS — M546 Pain in thoracic spine: Secondary | ICD-10-CM | POA: Diagnosis not present

## 2018-11-06 DIAGNOSIS — F17210 Nicotine dependence, cigarettes, uncomplicated: Secondary | ICD-10-CM | POA: Diagnosis not present

## 2018-11-06 MED ORDER — LOSARTAN POTASSIUM 100 MG PO TABS: 100.0000 mg | ORAL_TABLET | Freq: Every day | ORAL | 3 refills | Status: DC

## 2018-11-06 MED ORDER — HYDROCHLOROTHIAZIDE 25 MG PO TABS: 25.0000 mg | ORAL_TABLET | Freq: Every day | ORAL | 3 refills | Status: DC

## 2018-11-06 MED FILL — LOSARTAN POTASSIUM 100 MG T: 100 | 30 days supply | Qty: 30 | Fill #0

## 2018-11-06 MED FILL — HYDROCHLOROTHIAZIDE 25 MG T: 25 | 90 days supply | Qty: 90 | Fill #0

## 2018-11-06 NOTE — Progress Notes (Signed)
Virtual Visit via Video Note  I connected with Brad Hart on 11/06/18 at  3:00 PM EDT by a video enabled telemedicine application and verified that I am speaking with the correct person using two identifiers.  Location patient: home Location provider:work or home office Persons participating in the virtual visit: patient, provider  I discussed the limitations of evaluation and management by telemedicine and the availability of in person appointments. The patient expressed understanding and agreed to proceed.   HPI: Pt is a 28 yo male with pmh sig for HTN.   Pt previously seen by Dr. Martinique.  HTN: -taking losartan and HCTZ -not checking bp often.  States may be 120s/80s -tries to eat a balanced meal at home (meat, veggie, rice or tortilla) -occasionally eating fast food when at work. TEFL teacher for exercise.  L shoulder/back pain: -x 1 yr -notices more with working or carrying his kid -feeling is underneath shoulder blade and spine. -a burning sensation -hurts to lift arm -will take tylenol.  Desire for permanent contraception: -pt interested in a vasectomy. -has 2 kids.  Allergies: NKDA  Social Hx: Pt is married.  He has a 2.30 yo and a 7 mo. Pt just started working at [employer] by [employer].  Pt endorses use of tobacco.  Smokes on and off, more socially.  Started when he was 28 yo.  Has quit for 1 yr before.  Rare EtOH use.  Using MJ 2-3 x/wk, has cut down.   ROS: See pertinent positives and negatives per HPI.  Past Medical History:  Diagnosis Date  . Hyperlipidemia   . Hypertension     No past surgical history on file.  Family History  Adopted: Yes  Problem Relation Age of Onset  . Hemachromatosis Maternal Grandmother   . Heart disease Maternal Grandfather        CAD/MI     Current Outpatient Medications:  .  hydrochlorothiazide (HYDRODIURIL) 25 MG tablet, Take 1 tablet (25 mg total) by mouth daily for 10 days., Disp: 10 tablet, Rfl: 0 .  losartan  (COZAAR) 100 MG tablet, Take 1 tablet (100 mg total) by mouth daily for 10 days. Take 1 tab daily, Disp: 10 tablet, Rfl: 0  EXAM:  VITALS per patient if applicable: RR between 81-01 bpm  GENERAL: alert, oriented, appears well and in no acute distress  HEENT: atraumatic, conjunctiva clear, no obvious abnormalities on inspection of external nose and ears  NECK: normal movements of the head and neck  LUNGS: on inspection no signs of respiratory distress, breathing rate appears normal, no obvious gross SOB, gasping or wheezing  CV: no obvious cyanosis  MS: moves all visible extremities without noticeable abnormality  PSYCH/NEURO: pleasant and cooperative, no obvious depression or anxiety, speech and thought processing grossly intact  ASSESSMENT AND PLAN:  Discussed the following assessment and plan:  Hypertension, essential, benign  -controlled -pt encouraged to check bp at home -continue lifestyle modifications - Plan: hydrochlorothiazide (HYDRODIURIL) 25 MG tablet, losartan (COZAAR) 100 MG tablet  Cigarette nicotine dependence without complication  -smoking cessation >3 min, <10 min -smoking a few times per wk -discussed cutting down -offered info -will readdress at each OFV  Vasectomy evaluation  - Plan: Ambulatory referral to Urology  Chronic left-sided thoracic back pain -currently stable -likely rhomboid muscle strain -discussed supportive care: stretching, massage, heat, tylenol or NSAIDs sparingly 2/2 HTN -consider further eval and PT for continued symptoms.  F/u prn in the next month for BP and back pain  I discussed the assessment and treatment plan with the patient. The patient was provided an opportunity to ask questions and all were answered. The patient agreed with the plan and demonstrated an understanding of the instructions.   The patient was advised to call back or seek an in-person evaluation if the symptoms worsen or if the condition fails to improve  as anticipated.   Billie Ruddy, MD

## 2018-12-11 MED FILL — LOSARTAN POTASSIUM 100 MG T: 100 | 30 days supply | Qty: 30 | Fill #1

## 2018-12-25 MED FILL — DIAZEPAM 10 MG TABS: 10 | 1 days supply | Qty: 2 | Fill #0

## 2019-01-11 ENCOUNTER — Telehealth: Payer: Self-pay | Admitting: Family Medicine

## 2019-01-11 NOTE — Telephone Encounter (Signed)
Copied from [place] [id number]. Topic: General - Other >> Jan 11, 2019 11:52 AM Keene Breath wrote: Reason for CRM: Patient's wife called to update her husbands insurance information.  Please call patient back to discuss some issues.  CB# [phone]   I have updated the insurance information and sent an e-mail to charge pro fee to re file the DOS.

## 2019-01-12 MED FILL — LOSARTAN POTASSIUM 100 MG T: 100 | 30 days supply | Qty: 30 | Fill #2

## 2019-01-16 ENCOUNTER — Ambulatory Visit: Payer: No Typology Code available for payment source | Admitting: Family Medicine

## 2019-02-14 ENCOUNTER — Other Ambulatory Visit: Payer: Self-pay

## 2019-02-14 DIAGNOSIS — Z20822 Contact with and (suspected) exposure to covid-19: Secondary | ICD-10-CM

## 2019-02-15 LAB — NOVEL CORONAVIRUS, NAA: SARS-CoV-2, NAA: NOT DETECTED

## 2019-02-21 MED FILL — LOSARTAN POTASSIUM 100 MG T: 100 | 30 days supply | Qty: 30 | Fill #3

## 2019-02-21 MED FILL — HYDROCHLOROTHIAZIDE 25 MG T: 25 | 90 days supply | Qty: 90 | Fill #1

## 2019-04-02 MED FILL — LOSARTAN POTASSIUM 100 MG T: 100 | 30 days supply | Qty: 30 | Fill #4

## 2019-05-08 MED FILL — LOSARTAN POTASSIUM 100 MG T: 100 | 30 days supply | Qty: 30 | Fill #5

## 2019-06-05 ENCOUNTER — Encounter: Payer: Self-pay | Admitting: Family Medicine

## 2019-06-14 MED FILL — LOSARTAN POTASSIUM 100 MG T: 100 | 30 days supply | Qty: 30 | Fill #6

## 2019-06-26 MED FILL — HYDROCHLOROTHIAZIDE 25 MG T: 25 | 90 days supply | Qty: 90 | Fill #2

## 2019-06-28 ENCOUNTER — Other Ambulatory Visit: Payer: Self-pay

## 2019-06-28 ENCOUNTER — Encounter: Payer: Self-pay | Admitting: Family Medicine

## 2019-06-28 ENCOUNTER — Ambulatory Visit (INDEPENDENT_AMBULATORY_CARE_PROVIDER_SITE_OTHER): Payer: No Typology Code available for payment source | Admitting: Family Medicine

## 2019-06-28 VITALS — BP 128/90 | HR 92 | Temp 97.7°F | Wt 242.0 lb

## 2019-06-28 DIAGNOSIS — E781 Pure hyperglyceridemia: Secondary | ICD-10-CM

## 2019-06-28 DIAGNOSIS — Z6841 Body Mass Index (BMI) 40.0 and over, adult: Secondary | ICD-10-CM | POA: Diagnosis not present

## 2019-06-28 DIAGNOSIS — R030 Elevated blood-pressure reading, without diagnosis of hypertension: Secondary | ICD-10-CM

## 2019-06-28 DIAGNOSIS — G5603 Carpal tunnel syndrome, bilateral upper limbs: Secondary | ICD-10-CM

## 2019-06-28 DIAGNOSIS — Z0000 Encounter for general adult medical examination without abnormal findings: Secondary | ICD-10-CM | POA: Diagnosis not present

## 2019-06-28 DIAGNOSIS — S29012A Strain of muscle and tendon of back wall of thorax, initial encounter: Secondary | ICD-10-CM

## 2019-06-28 MED ORDER — MELOXICAM 7.5 MG PO TABS: 7.5000 mg | ORAL_TABLET | Freq: Every day | ORAL | 0 refills | Status: DC

## 2019-06-28 MED FILL — MELOXICAM 7.5 MG TABLET: 7.5 | 30 days supply | Qty: 30 | Fill #0

## 2019-06-28 NOTE — Patient Instructions (Signed)
Preventive Care 19-31 Years Old, Male Preventive care refers to lifestyle choices and visits with your health care provider that can promote health and wellness. This includes:  A yearly physical exam. This is also called an annual well check.  Regular dental and eye exams.  Immunizations.  Screening for certain conditions.  Healthy lifestyle choices, such as eating a healthy diet, getting regular exercise, not using drugs or products that contain nicotine and tobacco, and limiting alcohol use. What can I expect for my preventive care visit? Physical exam Your health care provider will check:  Height and weight. These may be used to calculate body mass index (BMI), which is a measurement that tells if you are at a healthy weight.  Heart rate and blood pressure.  Your skin for abnormal spots. Counseling Your health care provider may ask you questions about:  Alcohol, tobacco, and drug use.  Emotional well-being.  Home and relationship well-being.  Sexual activity.  Eating habits.  Work and work Statistician. What immunizations do I need?  Influenza (flu) vaccine  This is recommended every year. Tetanus, diphtheria, and pertussis (Tdap) vaccine  You may need a Td booster every 10 years. Varicella (chickenpox) vaccine  You may need this vaccine if you have not already been vaccinated. Human papillomavirus (HPV) vaccine  If recommended by your health care provider, you may need three doses over 6 months. Measles, mumps, and rubella (MMR) vaccine  You may need at least one dose of MMR. You may also need a second dose. Meningococcal conjugate (MenACWY) vaccine  One dose is recommended if you are 45-76 years old and a Market researcher living in a residence hall, or if you have one of several medical conditions. You may also need additional booster doses. Pneumococcal conjugate (PCV13) vaccine  You may need this if you have certain conditions and were not  previously vaccinated. Pneumococcal polysaccharide (PPSV23) vaccine  You may need one or two doses if you smoke cigarettes or if you have certain conditions. Hepatitis A vaccine  You may need this if you have certain conditions or if you travel or work in places where you may be exposed to hepatitis A. Hepatitis B vaccine  You may need this if you have certain conditions or if you travel or work in places where you may be exposed to hepatitis B. Haemophilus influenzae type b (Hib) vaccine  You may need this if you have certain risk factors. You may receive vaccines as individual doses or as more than one vaccine together in one shot (combination vaccines). Talk with your health care provider about the risks and benefits of combination vaccines. What tests do I need? Blood tests  Lipid and cholesterol levels. These may be checked every 5 years starting at age 17.  Hepatitis C test.  Hepatitis B test. Screening   Diabetes screening. This is done by checking your blood sugar (glucose) after you have not eaten for a while (fasting).  Sexually transmitted disease (STD) testing. Talk with your health care provider about your test results, treatment options, and if necessary, the need for more tests. Follow these instructions at home: Eating and drinking   Eat a diet that includes fresh fruits and vegetables, whole grains, lean protein, and low-fat dairy products.  Take vitamin and mineral supplements as recommended by your health care provider.  Do not drink alcohol if your health care provider tells you not to drink.  If you drink alcohol: ? Limit how much you have to 0-2  drinks a day. ? Be aware of how much alcohol is in your drink. In the [place] one drink equals one 12 oz bottle of beer (355 mL), one 5 oz glass of wine (148 mL), or one 1 oz glass of hard liquor (44 mL). Lifestyle  Take daily care of your teeth and gums.  Stay active. Exercise for at least 30 minutes on 5 or  more days each week.  Do not use any products that contain nicotine or tobacco, such as cigarettes, e-cigarettes, and chewing tobacco. If you need help quitting, ask your health care provider.  If you are sexually active, practice safe sex. Use a condom or other form of protection to prevent STIs (sexually transmitted infections). What's next?  Go to your health care provider once a year for a well check visit.  Ask your health care provider how often you should have your eyes and teeth checked.  Stay up to date on all vaccines. This information is not intended to replace advice given to you by your health care provider. Make sure you discuss any questions you have with your health care provider. Document Revised: 04/13/2018 Document Reviewed: 04/13/2018 Elsevier Patient Education  2020 [place].  Muscle Strain A muscle strain is an injury that occurs when a muscle is stretched beyond its normal length. Usually, a small number of muscle fibers are torn when this happens. There are three types of muscle strains. First-degree strains have the least amount of muscle fiber tearing and the least amount of pain. Second-degree and third-degree strains have more tearing and pain. Usually, recovery from muscle strain takes 1-2 weeks. Complete healing normally takes 5-6 weeks. What are the causes? This condition is caused when a sudden, violent force is placed on a muscle and stretches it too far. This may occur with a fall, lifting, or sports. What increases the risk? This condition is more likely to develop in athletes and people who are physically active. What are the signs or symptoms? Symptoms of this condition include:  Pain.  Bruising.  Swelling.  Trouble using the muscle. How is this diagnosed? This condition is diagnosed based on a physical exam and your medical history. Tests may also be done, including an X-ray, ultrasound, or MRI. How is this treated? This condition is  initially treated with PRICE therapy. This therapy involves:  Protecting the muscle from being injured again.  Resting the injured muscle.  Icing the injured muscle.  Applying pressure (compression) to the injured muscle. This may be done with a splint or elastic bandage.  Raising (elevating) the injured muscle. Your health care provider may also recommend medicine for pain. Follow these instructions at home: If you have a splint:  Wear the splint as told by your health care provider. Remove it only as told by your health care provider.  Loosen the splint if your fingers or toes tingle, become numb, or turn cold and blue.  Keep the splint clean.  If the splint is not waterproof: ? Do not let it get wet. ? Cover it with a watertight covering when you take a bath or a shower. Managing pain, stiffness, and swelling   If directed, put ice on the injured area. ? If you have a removable splint, remove it as told by your health care provider. ? Put ice in a plastic bag. ? Place a towel between your skin and the bag. ? Leave the ice on for 20 minutes, 2-3 times a day.  Move your fingers  or toes often to avoid stiffness and to lessen swelling.  Raise (elevate) the injured area above the level of your heart while you are sitting or lying down.  Wear an elastic bandage as told by your health care provider. Make sure that it is not too tight. General instructions  Take over-the-counter and prescription medicines only as told by your health care provider.  Restrict your activity and rest the injured muscle as told by your health care provider. Gentle movements may be allowed.  If physical therapy was prescribed, do exercises as told by your health care provider.  Do not put pressure on any part of the splint until it is fully hardened. This may take several hours.  Do not use any products that contain nicotine or tobacco, such as cigarettes and e-cigarettes. These can delay bone  healing. If you need help quitting, ask your health care provider.  Ask your health care provider when it is safe to drive if you have a splint.  Keep all follow-up visits as told by your health care provider. This is important. How is this prevented?  Warm up before exercising. This helps to prevent future muscle strains. Contact a health care provider if:  You have more pain or swelling in the injured area. Get help right away if:  You have numbness or tingling or lose a lot of strength in the injured area. Summary  A muscle strain is an injury that occurs when a muscle is stretched beyond its normal length.  This condition is caused when a sudden, violent force is placed on a muscle and stretches it too far.  This condition is initially treated with PRICE therapy, which involves protecting, resting, icing, compressing, and elevating.  Gentle movements may be allowed. If physical therapy was prescribed, do exercises as told by your health care provider. This information is not intended to replace advice given to you by your health care provider. Make sure you discuss any questions you have with your health care provider. Document Revised: 04/01/2017 Document Reviewed: 05/26/2016 Elsevier Patient Education  [place].  Carpal Tunnel Syndrome  Carpal tunnel syndrome is a condition that causes pain in your hand and arm. The carpal tunnel is a narrow area located on the palm side of your wrist. Repeated wrist motion or certain diseases may cause swelling within the tunnel. This swelling pinches the main nerve in the wrist (median nerve). What are the causes? This condition may be caused by:  Repeated wrist motions.  Wrist injuries.  Arthritis.  A cyst or tumor in the carpal tunnel.  Fluid buildup during pregnancy. Sometimes the cause of this condition is not known. What increases the risk? The following factors may make you more likely to develop this  condition:  Having a job, such as being a Research scientist (life sciences), that requires you to repeatedly move your wrist in the same motion.  Being a woman.  Having certain conditions, such as: ? Diabetes. ? Obesity. ? An underactive thyroid (hypothyroidism). ? Kidney failure. What are the signs or symptoms? Symptoms of this condition include:  A tingling feeling in your fingers, especially in your thumb, index, and middle fingers.  Tingling or numbness in your hand.  An aching feeling in your entire arm, especially when your wrist and elbow are bent for a long time.  Wrist pain that goes up your arm to your shoulder.  Pain that goes down into your palm or fingers.  A weak feeling in your  hands. You may have trouble grabbing and holding items. Your symptoms may feel worse during the night. How is this diagnosed? This condition is diagnosed with a medical history and physical exam. You may also have tests, including:  Electromyogram (EMG). This test measures electrical signals sent by your nerves into the muscles.  Nerve conduction study. This test measures how well electrical signals pass through your nerves.  Imaging tests, such as X-rays, ultrasound, and MRI. These tests check for possible causes of your condition. How is this treated? This condition may be treated with:  Lifestyle changes. It is important to stop or change the activity that caused your condition.  Doing exercise and activities to strengthen your muscles and bones (physical therapy).  Learning how to use your hand again after diagnosis (occupational therapy).  Medicines for pain and inflammation. This may include medicine that is injected into your wrist.  A wrist splint.  Surgery. Follow these instructions at home: If you have a splint:  Wear the splint as told by your health care provider. Remove it only as told by your health care provider.  Loosen the splint if your fingers tingle, become numb, or  turn cold and blue.  Keep the splint clean.  If the splint is not waterproof: ? Do not let it get wet. ? Cover it with a watertight covering when you take a bath or shower. Managing pain, stiffness, and swelling   If directed, put ice on the painful area: ? If you have a removable splint, remove it as told by your health care provider. ? Put ice in a plastic bag. ? Place a towel between your skin and the bag. ? Leave the ice on for 20 minutes, 2-3 times per day. General instructions  Take over-the-counter and prescription medicines only as told by your health care provider.  Rest your wrist from any activity that may be causing your pain. If your condition is work related, talk with your employer about changes that can be made, such as getting a wrist pad to use while typing.  Do any exercises as told by your health care provider, physical therapist, or occupational therapist.  Keep all follow-up visits as told by your health care provider. This is important. Contact a health care provider if:  You have new symptoms.  Your pain is not controlled with medicines.  Your symptoms get worse. Get help right away if:  You have severe numbness or tingling in your wrist or hand. Summary  Carpal tunnel syndrome is a condition that causes pain in your hand and arm.  It is usually caused by repeated wrist motions.  Lifestyle changes and medicines are used to treat carpal tunnel syndrome. Surgery may be recommended.  Follow your health care provider's instructions about wearing a splint, resting from activity, keeping follow-up visits, and calling for help. This information is not intended to replace advice given to you by your health care provider. Make sure you discuss any questions you have with your health care provider. Document Revised: 08/26/2017 Document Reviewed: 08/26/2017 Elsevier Patient Education  [place].

## 2019-07-01 ENCOUNTER — Encounter: Payer: Self-pay | Admitting: Family Medicine

## 2019-07-01 NOTE — Progress Notes (Signed)
Subjective:     Brad Hart is a 29 y.o. male and is here for a comprehensive physical exam. The patient reports problems - numbness in wrist, shoulder and neck pain.  Pt works in [employer], notes a recent increase in business.  Pt does heavy lifting and repetitive motions at work.  Notes a deep pain in back on medial edge of L scapula.  Also endorses intermittent b/l wrist pain, numbness, and tingling in hands.  Social History   Socioeconomic History  . Marital status: Married    Spouse name: Not on file  . Number of children: Not on file  . Years of education: Not on file  . Highest education level: Not on file  Occupational History  . Not on file  Tobacco Use  . Smoking status: Former Smoker    Quit date: 09/15/2016    Years since quitting: 2.7  . Smokeless tobacco: Never Used  Substance and Sexual Activity  . Alcohol use: Yes    Comment: OCCASIONAL  . Drug use: No  . Sexual activity: Yes  Other Topics Concern  . Not on file  Social History Narrative  . Not on file   Social Determinants of Health   Financial Resource Strain:   . Difficulty of Paying Living Expenses: Not on file  Food Insecurity:   . Worried About Charity fundraiser in the Last Year: Not on file  . Ran Out of Food in the Last Year: Not on file  Transportation Needs:   . Lack of Transportation (Medical): Not on file  . Lack of Transportation (Non-Medical): Not on file  Physical Activity:   . Days of Exercise per Week: Not on file  . Minutes of Exercise per Session: Not on file  Stress:   . Feeling of Stress : Not on file  Social Connections:   . Frequency of Communication with Friends and Family: Not on file  . Frequency of Social Gatherings with Friends and Family: Not on file  . Attends Religious Services: Not on file  . Active Member of Clubs or Organizations: Not on file  . Attends Archivist Meetings: Not on file  . Marital Status: Not on file  Intimate Partner Violence:   .  Fear of Current or Ex-Partner: Not on file  . Emotionally Abused: Not on file  . Physically Abused: Not on file  . Sexually Abused: Not on file   Health Maintenance  Topic Date Due  . HIV Screening  01/12/2004  . TETANUS/TDAP  01/12/2008  . INFLUENZA VACCINE  12/02/2018    The following portions of the patient's history were reviewed and updated as appropriate: allergies, current medications, past family history, past medical history, past social history, past surgical history and problem list.  Review of Systems Pertinent items noted in HPI and remainder of comprehensive ROS otherwise negative.   Objective:    BP 128/90 (BP Location: Left Arm, Patient Position: Sitting, Cuff Size: Large)   Pulse 92   Temp 97.7 F (36.5 C) (Temporal)   Wt 242 lb (109.8 kg)   SpO2 98%   BMI 36.80 kg/m  General appearance: alert, cooperative and no distress Head: Normocephalic, without obvious abnormality, atraumatic Eyes: conjunctivae/corneas clear. PERRL, EOM's intact. Fundi benign. Ears: normal TM's and external ear canals both ears Nose: Nares normal. Septum midline. Mucosa normal. No drainage or sinus tenderness. Throat: lips, mucosa, and tongue normal; teeth and gums normal Neck: no adenopathy, no carotid bruit, no JVD, supple,  symmetrical, trachea midline and thyroid not enlarged, symmetric, no tenderness/mass/nodules Back: No TTP of cervical, thoracic, or lumbar spine.  TTP of L lateral scapual at rhomboid major.  No TTP of clavicle and shoulder b/l.  FROM of UEs b/l.  Postive lift off test.  negative Neers, Hawkins, empty can, cross arm. Lungs: clear to auscultation bilaterally Heart: regular rate and rhythm, S1, S2 normal, no murmur, click, rub or gallop Abdomen: soft, non-tender; bowel sounds normal; no masses,  no organomegaly Extremities: extremities normal, atraumatic, no cyanosis or edema and TTP of R lateral epicondyle.  +Phalen's. Pulses: 2+ and symmetric Skin: Skin color,  texture, turgor normal. No rashes or lesions Lymph nodes: Cervical, supraclavicular, and axillary nodes normal. Neurologic: Alert and oriented X 3, normal strength and tone. Normal symmetric reflexes. Normal coordination and gait   +Phalen's and TTP of R lateral epicondyle.   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     Plan:     Anticipatory guidance given including wearing seatbelts, smoke detectors in the home, increasing physical activity, increasing p.o. intake of water and vegetables. -will obtain labs -given handout -next CPE in 1 yrs See After Visit Summary for Counseling Recommendations    Bilateral carpal tunnel syndrome  -discussed treatment options. -continue supportive care.  Consider wrist splints at night -given handout - Plan: TSH, T4, Free, Vitamin B12, Folate  Muscle strain of left upper back, initial encounter  -discussed supportive care: stretching, heat, massage, NSAIDs -declined muscle relaxer at this time -consider PT for continued symptoms - Plan: meloxicam (MOBIC) 7.5 MG tablet  Hypertriglyceridemia  -lifestyle modifications - Plan: Lipid panel  Morbid obesity with BMI of 40.0-44.9, adult (HCC)  -lifestyle modifications - Plan: TSH, T4, Free, Hemoglobin A1c  Elevated blood pressure reading without diagnosis of hypertension -recheck -lifestyle modifications -continue to monitor  F/u prn  Abbe Amsterdam, MD

## 2019-07-03 ENCOUNTER — Other Ambulatory Visit (INDEPENDENT_AMBULATORY_CARE_PROVIDER_SITE_OTHER): Payer: No Typology Code available for payment source

## 2019-07-03 ENCOUNTER — Other Ambulatory Visit: Payer: Self-pay

## 2019-07-03 DIAGNOSIS — E781 Pure hyperglyceridemia: Secondary | ICD-10-CM | POA: Diagnosis not present

## 2019-07-03 LAB — CBC WITH DIFFERENTIAL/PLATELET
Basophils Absolute: 0.1 10*3/uL (ref 0.0–0.1)
Basophils Relative: 0.9 % (ref 0.0–3.0)
Eosinophils Absolute: 0.2 10*3/uL (ref 0.0–0.7)
Eosinophils Relative: 2.3 % (ref 0.0–5.0)
HCT: 39.5 % (ref 39.0–52.0)
Hemoglobin: 13.9 g/dL (ref 13.0–17.0)
Lymphocytes Relative: 33.7 % (ref 12.0–46.0)
Lymphs Abs: 2.3 10*3/uL (ref 0.7–4.0)
MCHC: 35.1 g/dL (ref 30.0–36.0)
MCV: 87.5 fl (ref 78.0–100.0)
Monocytes Absolute: 0.4 10*3/uL (ref 0.1–1.0)
Monocytes Relative: 6.2 % (ref 3.0–12.0)
Neutro Abs: 3.9 10*3/uL (ref 1.4–7.7)
Neutrophils Relative %: 56.9 % (ref 43.0–77.0)
Platelets: 227 10*3/uL (ref 150.0–400.0)
RBC: 4.51 Mil/uL (ref 4.22–5.81)
RDW: 12.9 % (ref 11.5–15.5)
WBC: 6.8 10*3/uL (ref 4.0–10.5)

## 2019-07-03 LAB — FOLATE: Folate: 13.5 ng/mL (ref >5.9)

## 2019-07-03 LAB — BASIC METABOLIC PANEL
BUN: 19 mg/dL (ref 6–23)
CO2: 27 mEq/L (ref 19–32)
Calcium: 9.6 mg/dL (ref 8.4–10.5)
Chloride: 104 mEq/L (ref 96–112)
Creatinine, Ser: 1.15 mg/dL (ref 0.40–1.50)
GFR: 74.43 mL/min (ref >60.00)
Glucose, Bld: 99 mg/dL (ref 70–99)
Potassium: 4 mEq/L (ref 3.5–5.1)
Sodium: 138 mEq/L (ref 135–145)

## 2019-07-03 LAB — LIPID PANEL
Cholesterol: 170 mg/dL (ref 0–200)
HDL: 47.3 mg/dL (ref >39.00)
LDL Cholesterol: 104 mg/dL — ABNORMAL HIGH (ref 0–99)
NonHDL: 123.09
Total CHOL/HDL Ratio: 4
Triglycerides: 96 mg/dL (ref 0.0–149.0)
VLDL: 19.2 mg/dL (ref 0.0–40.0)

## 2019-07-03 LAB — HEMOGLOBIN A1C: Hgb A1c MFr Bld: 5.1 % (ref 4.6–6.5)

## 2019-07-03 LAB — TSH: TSH: 1.73 u[IU]/mL (ref 0.35–4.50)

## 2019-07-03 LAB — T4, FREE: Free T4: 0.81 ng/dL (ref 0.60–1.60)

## 2019-07-03 LAB — VITAMIN B12: Vitamin B-12: 254 pg/mL (ref 211–911)

## 2019-07-12 ENCOUNTER — Ambulatory Visit (INDEPENDENT_AMBULATORY_CARE_PROVIDER_SITE_OTHER): Payer: No Typology Code available for payment source | Admitting: Psychology

## 2019-07-12 DIAGNOSIS — F331 Major depressive disorder, recurrent, moderate: Secondary | ICD-10-CM | POA: Diagnosis not present

## 2019-07-23 MED FILL — LOSARTAN POTASSIUM 100 MG T: 100 | 30 days supply | Qty: 30 | Fill #7

## 2019-08-21 ENCOUNTER — Encounter: Payer: Self-pay | Admitting: Family Medicine

## 2019-08-22 ENCOUNTER — Other Ambulatory Visit: Payer: Self-pay

## 2019-08-22 DIAGNOSIS — I10 Essential (primary) hypertension: Secondary | ICD-10-CM

## 2019-08-22 MED ORDER — HYDROCHLOROTHIAZIDE 25 MG PO TABS: 25.0000 mg | ORAL_TABLET | Freq: Every day | ORAL | 3 refills | Status: DC

## 2019-08-22 MED ORDER — LOSARTAN POTASSIUM 100 MG PO TABS: 100.0000 mg | ORAL_TABLET | Freq: Every day | ORAL | 3 refills | Status: DC

## 2019-08-22 MED FILL — LOSARTAN POTASSIUM 100 MG T: 100 | 90 days supply | Qty: 90 | Fill #0

## 2019-08-22 MED FILL — HYDROCHLOROTHIAZIDE 25 MG T: 25 | 90 days supply | Qty: 90 | Fill #0

## 2019-09-03 ENCOUNTER — Other Ambulatory Visit: Payer: Self-pay

## 2019-09-03 ENCOUNTER — Ambulatory Visit (INDEPENDENT_AMBULATORY_CARE_PROVIDER_SITE_OTHER)
Admission: RE | Admit: 2019-09-03 | Discharge: 2019-09-03 | Disposition: A | Discharge status: Other Acute Inpatient Hospital | Payer: No Typology Code available for payment source | Source: Ambulatory Visit

## 2019-09-03 ENCOUNTER — Ambulatory Visit (INDEPENDENT_AMBULATORY_CARE_PROVIDER_SITE_OTHER): Payer: No Typology Code available for payment source

## 2019-09-03 ENCOUNTER — Encounter (HOSPITAL_COMMUNITY): Payer: Self-pay

## 2019-09-03 ENCOUNTER — Ambulatory Visit (HOSPITAL_COMMUNITY)
Admission: EM | Admit: 2019-09-03 | Discharge: 2019-09-03 | Disposition: A | Discharge status: Home / Self Care | Payer: No Typology Code available for payment source | Attending: Internal Medicine | Admitting: Internal Medicine

## 2019-09-03 DIAGNOSIS — M25531 Pain in right wrist: Secondary | ICD-10-CM

## 2019-09-03 DIAGNOSIS — S63501A Unspecified sprain of right wrist, initial encounter: Secondary | ICD-10-CM

## 2019-09-03 DIAGNOSIS — M25539 Pain in unspecified wrist: Secondary | ICD-10-CM

## 2019-09-03 NOTE — ED Provider Notes (Signed)
Patient will come for in-person evaluation of wrist injury.    Mickie Bail, NP 09/03/19 1304

## 2019-09-03 NOTE — ED Triage Notes (Signed)
Pt is here with a right wrist injury after falling up the stairs and landed on wrist that was Saturday. Pt has taken Advil to relieve discomfort.

## 2019-09-03 NOTE — Discharge Instructions (Signed)
Come to the Urgent Care for evaluation of your wrist in person.

## 2019-09-04 NOTE — ED Provider Notes (Signed)
Brad Hart    CSN: [id number] Arrival date & time: 09/03/19  1342      History   Chief Complaint Chief Complaint  Patient presents with  . Wrist Injury    HPI Brad Hart is a 29 y.o. male with no past medical history comes to the urgent care with right wrist pain following a mechanical fall on his stairs. He broke his fall with his hands. Pain is moderate severity, sharp and constant. It is worsened by palpation and movement. No known relieving factors. No numbness or tingling in the hand or fingers.No swelling,bruising or bleeding.   HPI  Past Medical History:  Diagnosis Date  . Hyperlipidemia   . Hypertension     Patient Active Problem List   Diagnosis Date Noted  . Hypertension, essential, benign 02/15/2017  . Hypertriglyceridemia 02/15/2017  . Morbid obesity with BMI of 40.0-44.9, adult ([place]) 02/15/2017    History reviewed. No pertinent surgical history.     Home Medications    Prior to Admission medications   Medication Sig Start Date End Date Taking? Authorizing Provider  hydrochlorothiazide (HYDRODIURIL) 25 MG tablet Take 1 tablet (25 mg total) by mouth daily. 08/22/19   Billie Ruddy, MD  losartan (COZAAR) 100 MG tablet Take 1 tablet (100 mg total) by mouth daily. Take 1 tab daily 08/22/19   Billie Ruddy, MD    Family History Family History  Adopted: Yes  Problem Relation Age of Onset  . Hemachromatosis Maternal Grandmother   . Heart disease Maternal Grandfather        CAD/MI    Social History Social History   Tobacco Use  . Smoking status: Former Smoker    Quit date: 09/15/2016    Years since quitting: 2.9  . Smokeless tobacco: Never Used  Substance Use Topics  . Alcohol use: Yes    Comment: OCCASIONAL  . Drug use: No     Allergies   Patient has no known allergies.   Review of Systems Review of Systems  Constitutional: Negative.   Respiratory: Negative.   Musculoskeletal: Positive for arthralgias. Negative  for neck pain and neck stiffness.  Skin: Negative.   Neurological: Negative.      Physical Exam Triage Vital Signs ED Triage Vitals  Enc Vitals Group     BP 09/03/19 1426 (!) 138/92     Pulse Rate 09/03/19 1426 73     Resp 09/03/19 1426 19     Temp 09/03/19 1426 97.7 F (36.5 C)     Temp Source 09/03/19 1426 Oral     SpO2 09/03/19 1426 99 %     Weight 09/03/19 1434 235 lb (106.6 kg)     Height --      Head Circumference --      Peak Flow --      Pain Score 09/03/19 1433 6     Pain Loc --      Pain Edu? --      Excl. in [place]? --    No data found.  Updated Vital Signs BP (!) 138/92 (BP Location: Right Arm)   Pulse 73   Temp 97.7 F (36.5 C) (Oral)   Resp 19   Wt 106.6 kg   SpO2 99%   BMI 35.73 kg/m   Visual Acuity Right Eye Distance:   Left Eye Distance:   Bilateral Distance:    Right Eye Near:   Left Eye Near:    Bilateral Near:     Physical Exam  Vitals and nursing note reviewed.  Constitutional:      General: He is in acute distress.     Appearance: He is not ill-appearing.  Cardiovascular:     Rate and Rhythm: Normal rate and regular rhythm.  Musculoskeletal:     Comments: ROM around the right wrist is limited by pain  Neurological:     Mental Status: He is alert.      UC Treatments / Results  Labs (all labs ordered are listed, but only abnormal results are displayed) Labs Reviewed - No data to display  EKG   Radiology DG Wrist Complete Right  Result Date: 09/03/2019 CLINICAL DATA:  Pain status post fall. EXAM: RIGHT WRIST - COMPLETE 3+ VIEW COMPARISON:  None. FINDINGS: There is no evidence of fracture or dislocation. There is no evidence of arthropathy or other focal bone abnormality. Soft tissues are unremarkable. IMPRESSION: Negative. Electronically Signed   By: Katherine Mantle M.D.   On: 09/03/2019 16:23    Procedures Procedures (including critical care time)  Medications Ordered in UC Medications - No data to display  Initial  Impression / Assessment and Plan / UC Course  I have reviewed the triage vital signs and the nursing notes.  Pertinent labs & imaging results that were available during my care of the patient were reviewed by me and considered in my medical decision making (see chart for details).     1. Right wrist sprain: Xray of the right wrist is negative for acute fracture Tylenol/motrin for pain Wrist splint Return precautions given Final Clinical Impressions(s) / UC Diagnoses   Final diagnoses:  Sprain of right wrist, initial encounter   Discharge Instructions   None    ED Prescriptions    None     PDMP not reviewed this encounter.   Merrilee Jansky, MD 09/04/19 2000

## 2019-11-12 ENCOUNTER — Ambulatory Visit (INDEPENDENT_AMBULATORY_CARE_PROVIDER_SITE_OTHER): Payer: No Typology Code available for payment source | Admitting: Psychology

## 2019-11-12 DIAGNOSIS — F331 Major depressive disorder, recurrent, moderate: Secondary | ICD-10-CM | POA: Diagnosis not present

## 2019-11-26 ENCOUNTER — Ambulatory Visit (INDEPENDENT_AMBULATORY_CARE_PROVIDER_SITE_OTHER): Payer: No Typology Code available for payment source | Admitting: Psychology

## 2019-11-26 DIAGNOSIS — F331 Major depressive disorder, recurrent, moderate: Secondary | ICD-10-CM | POA: Diagnosis not present

## 2019-12-10 ENCOUNTER — Ambulatory Visit (INDEPENDENT_AMBULATORY_CARE_PROVIDER_SITE_OTHER): Payer: No Typology Code available for payment source | Admitting: Psychology

## 2019-12-10 DIAGNOSIS — F331 Major depressive disorder, recurrent, moderate: Secondary | ICD-10-CM

## 2019-12-24 ENCOUNTER — Ambulatory Visit (INDEPENDENT_AMBULATORY_CARE_PROVIDER_SITE_OTHER): Payer: No Typology Code available for payment source | Admitting: Psychology

## 2019-12-24 ENCOUNTER — Telehealth: Payer: Self-pay | Admitting: Family Medicine

## 2019-12-24 DIAGNOSIS — F331 Major depressive disorder, recurrent, moderate: Secondary | ICD-10-CM

## 2019-12-24 MED FILL — LOSARTAN POTASSIUM 100 MG T: 100 | 90 days supply | Qty: 90 | Fill #1

## 2019-12-24 MED FILL — HYDROCHLOROTHIAZIDE 25 MG T: 25 | 90 days supply | Qty: 90 | Fill #1

## 2019-12-24 NOTE — Telephone Encounter (Signed)
error 

## 2019-12-31 ENCOUNTER — Ambulatory Visit (INDEPENDENT_AMBULATORY_CARE_PROVIDER_SITE_OTHER): Payer: No Typology Code available for payment source | Admitting: Psychology

## 2019-12-31 DIAGNOSIS — F331 Major depressive disorder, recurrent, moderate: Secondary | ICD-10-CM

## 2020-01-15 ENCOUNTER — Ambulatory Visit (INDEPENDENT_AMBULATORY_CARE_PROVIDER_SITE_OTHER): Payer: No Typology Code available for payment source | Admitting: Psychology

## 2020-01-15 DIAGNOSIS — F331 Major depressive disorder, recurrent, moderate: Secondary | ICD-10-CM

## 2020-01-22 ENCOUNTER — Ambulatory Visit (INDEPENDENT_AMBULATORY_CARE_PROVIDER_SITE_OTHER): Payer: No Typology Code available for payment source | Admitting: Psychology

## 2020-01-22 DIAGNOSIS — F331 Major depressive disorder, recurrent, moderate: Secondary | ICD-10-CM | POA: Diagnosis not present

## 2020-01-31 ENCOUNTER — Other Ambulatory Visit: Payer: No Typology Code available for payment source

## 2020-01-31 DIAGNOSIS — Z20822 Contact with and (suspected) exposure to covid-19: Secondary | ICD-10-CM

## 2020-02-01 LAB — NOVEL CORONAVIRUS, NAA: SARS-CoV-2, NAA: NOT DETECTED

## 2020-02-01 LAB — SARS-COV-2, NAA 2 DAY TAT

## 2020-02-04 ENCOUNTER — Ambulatory Visit (INDEPENDENT_AMBULATORY_CARE_PROVIDER_SITE_OTHER): Payer: No Typology Code available for payment source | Admitting: Psychology

## 2020-02-04 DIAGNOSIS — F331 Major depressive disorder, recurrent, moderate: Secondary | ICD-10-CM

## 2020-02-19 ENCOUNTER — Ambulatory Visit (INDEPENDENT_AMBULATORY_CARE_PROVIDER_SITE_OTHER): Payer: No Typology Code available for payment source | Admitting: Psychology

## 2020-02-19 DIAGNOSIS — F331 Major depressive disorder, recurrent, moderate: Secondary | ICD-10-CM | POA: Diagnosis not present

## 2020-03-13 ENCOUNTER — Ambulatory Visit (INDEPENDENT_AMBULATORY_CARE_PROVIDER_SITE_OTHER): Payer: No Typology Code available for payment source | Admitting: Family Medicine

## 2020-03-13 ENCOUNTER — Other Ambulatory Visit: Payer: Self-pay

## 2020-03-13 ENCOUNTER — Encounter: Payer: Self-pay | Admitting: Family Medicine

## 2020-03-13 VITALS — BP 130/90 | HR 93 | Temp 98.2°F | Wt 254.4 lb

## 2020-03-13 DIAGNOSIS — M7918 Myalgia, other site: Secondary | ICD-10-CM

## 2020-03-13 MED ORDER — PREDNISONE 10 MG PO TABS: ORAL_TABLET | ORAL | 0 refills | Status: DC

## 2020-03-13 MED FILL — predniSONE 10 MG TABS: 10 | 8 days supply | Qty: 23 | Fill #0

## 2020-03-13 NOTE — Patient Instructions (Signed)
Back Injury Prevention Back injuries can be very painful. They can also be difficult to heal. After having one back injury, you are more likely to have another one again. It is important to learn how to avoid injuring or re-injuring your back. The following tips can help you to prevent a back injury. What actions can I take to prevent back injuries? Nutrition changes Talk with your health care provider about your overall diet, and especially about foods that strengthen your bones.  Ask your health care provider how much calcium and vitamin D you need each day. These nutrients help to prevent weakening of the bones (osteoporosis). Osteoporosis can cause broken (fractured) bones, which lead to back pain.  Eat foods that are good sources of calcium. These include dairy products, green leafy vegetables, and products that have had calcium added to them (fortified).  Eat foods that are good sources of vitamin D. These include milk and foods that are fortified with vitamin D.  If needed, take supplements and vitamins as directed by your health care provider. Physical fitness Physical fitness strengthens your bones and your muscles. It also increases your balance and strength.  Exercise for 30 minutes per day on most days of the week, or as directed by your health care provider. Make sure to: ? Do aerobic exercises, such as walking, jogging, biking, or swimming. ? Do exercises that increase balance and strength, such as tai chi and yoga. These can decrease your risk of falling and injuring your back. ? Do stretching exercises to help with flexibility. ? Develop strong abdominal muscles. Your abdominal muscles provide a lot of the support that your back needs.  Maintain a healthy weight. This helps to decrease your risk of a back injury. Good posture        Prevent back injuries by developing and maintaining a good posture. To do this successfully:  Sit up and stand up straight. Avoid leaning  forward when you sit or hunching over when you stand.  Choose chairs that have good low-back (lumbar) support.  If you work at a desk, sit close to it so you do not need to lean over. Keep your chin tucked in. Keep your neck drawn back, and keep your elbows bent at a right angle.  Sit high and close to the steering wheel when you drive. Add a lumbar support to your car seat, if needed.  Avoid sitting or standing in one position for very long. Take breaks to get up, stretch, and walk around at least one time every hour. Take breaks every hour if you are driving for long periods of time.  Sleep on your side with your knees slightly bent, or sleep on your back with a pillow under your knees.  Lifting, twisting, and reaching Back injuries are more likely to occur when carrying loads and twisting at the same time. When you bend and lift, or reach for items that are high up in shelves, use positions that put less stress on your back.  Heavy lifting ? Avoid heavy lifting, especially the kind of heavy lifting that is repetitive. If you must do heavy lifting:  Stretch before lifting.  Work slowly.  Rest between lifts.  Use a tool such as a cart or a dolly to move objects.  Make several small trips instead of carrying one heavy load.  Ask for help when you need it, especially when moving big or heavy objects. ? Follow these steps when lifting:  Stand with your feet  shoulder-width apart.  Get as close to the object as you can. Do not try to pick up a heavy object that is far from your body.  Use handles or lifting straps if they are available.  Bend at your knees. Squat down, but keep your heels off the floor.  Keep your shoulders pulled back, your chin tucked in, and your back straight.  Lift the object slowly while you tighten the muscles in your legs, abdomen, and buttocks. Keep the object as close to the center of your body as possible. ? Follow these steps when putting down a  heavy load:  Stand with your feet shoulder-width apart.  Lower the object slowly while you tighten the muscles in your legs, abdomen, and buttocks. Keep the object as close to the center of your body as possible.  Keep your shoulders pulled back, your chin tucked in, and your back straight.  Bend at your knees. Squat down, but keep your heels off the floor.  Use handles or lifting straps if they are available.  Twisting and reaching ? Avoid lifting heavy objects above your waist. ? Do not twist at your waist while you are lifting or carrying a load. If you need to turn, move your feet. ? Do not bend over without bending at your knees. ? Avoid reaching over your head, across a table, or for an object on a high surface.  Other changes   Avoid wet floors and icy ground. Keep sidewalks clear of ice to prevent falls.  Do not sleep on a mattress that is too soft or too hard.  Put heavier objects on shelves at waist level, and put lighter objects on lower or higher shelves.  Find ways to decrease your stress, such as by exercising, getting a massage, or practicing relaxation techniques. Stress can build up in your muscles. Tense muscles are more vulnerable to injury.  Talk with your health care provider if you feel anxious or depressed. These conditions can make back pain worse.  Wear flat heel shoes with cushioned soles.  Use both shoulder straps when carrying a backpack.  Do not use any products that contain nicotine or tobacco, such as cigarettes and e-cigarettes. If you need help quitting, ask your health care provider. Summary  Back injuries can be very painful and difficult to heal.  You can prevent injuring or re-injuring your back by making nutrition changes, working on being physically fit, developing a good posture, and lifting heavy objects in a safe way.  Making other changes can also help to prevent back injuries. These include eating a healthy diet, exercising  regularly and maintaining a healthy weight. This information is not intended to replace advice given to you by your health care provider. Make sure you discuss any questions you have with your health care provider. Document Revised: 01/10/2019 Document Reviewed: 06/04/2017 Elsevier Patient Education  2020 [employer].  Thoracic Strain Rehab Ask your health care provider which exercises are safe for you. Do exercises exactly as told by your health care provider and adjust them as directed. It is normal to feel mild stretching, pulling, tightness, or discomfort as you do these exercises. Stop right away if you feel sudden pain or your pain gets worse. Do not begin these exercises until told by your health care provider. Stretching and range-of-motion exercise This exercise warms up your muscles and joints and improves the movement and flexibility of your back and shoulders. This exercise also helps to relieve pain. Chest and spine  stretch  1. Lie down on your back on a firm surface. 2. Roll a towel or a small blanket so it is about 4 inches (10 cm) in diameter. 3. Put the towel lengthwise under the middle of your back so it is under your spine, but not under your shoulder blades. 4. Put your hands behind your head and let your elbows fall to your sides. This will increase your stretch. 5. Take a deep breath (inhale). 6. Hold for __________ seconds. 7. Relax after you breathe out (exhale). Repeat __________ times. Complete this exercise __________ times a day. Strengthening exercises These exercises build strength and endurance in your back and your shoulder blade muscles. Endurance is the ability to use your muscles for a long time, even after they get tired. Alternating arm and leg raises  1. Get on your hands and knees on a firm surface. If you are on a hard floor, you may want to use padding, such as an exercise mat, to cushion your knees. 2. Line up your arms and legs. Your hands should  be directly below your shoulders, and your knees should be directly below your hips. 3. Lift your left leg behind you. At the same time, raise your right arm and straighten it in front of you. ? Do not lift your leg higher than your hip. ? Do not lift your arm higher than your shoulder. ? Keep your abdominal and back muscles tight. ? Keep your hips facing the ground. ? Do not arch your back. ? Keep your balance carefully, and do not hold your breath. 4. Hold for __________ seconds. 5. Slowly return to the starting position and repeat with your right leg and your left arm. Repeat __________ times. Complete this exercise __________ times a day. Straight arm rows This exercise is also called shoulder extension exercise. 1. Stand with your feet shoulder width apart. 2. Secure an exercise band to a stable object in front of you so the band is at or above shoulder height. 3. Hold one end of the exercise band in each hand. 4. Straighten your elbows and lift your hands up to shoulder height. 5. Step back, away from the secured end of the exercise band, until the band stretches. 6. Squeeze your shoulder blades together and pull your hands down to the sides of your thighs. Stop when your hands are straight down by your sides. This is shoulder extension. Do not let your hands go behind your body. 7. Hold for __________ seconds. 8. Slowly return to the starting position. Repeat __________ times. Complete this exercise __________ times a day. Prone shoulder external rotation 1. Lie on your abdomen on a firm bed so your left / right forearm hangs over the edge of the bed and your upper arm is on the bed, straight out from your body. This is the prone position. ? Your elbow should be bent. ? Your palm should be facing your feet. 2. If instructed, hold a __________ weight in your hand. 3. Squeeze your shoulder blade toward the middle of your back. Do not let your shoulder lift toward your ear. 4. Keep  your elbow bent in a 90-degree angle (right angle) while you slowly move your forearm up toward the ceiling. Move your forearm up to the height of the bed, toward your head. This is external rotation. ? Your upper arm should not move. ? At the top of the movement, your palm should face the floor. 5. Hold for __________ seconds. 6. Slowly  return to the starting position and relax your muscles. Repeat __________ times. Complete this exercise __________ times a day. Rowing scapular retraction This is an exercise in which the shoulder blades (scapulae) are pulled toward each other (retraction). 1. Sit in a stable chair without armrests, or stand up. 2. Secure an exercise band to a stable object in front of you so the band is at shoulder height. 3. Hold one end of the exercise band in each hand. Your palms should face down. 4. Bring your arms out straight in front of you. 5. Step back, away from the secured end of the exercise band, until the band stretches. 6. Pull the band backward. As you do this, bend your elbows and squeeze your shoulder blades together, but avoid letting the rest of your body move. Do not shrug your shoulders upward while you do this. 7. Stop when your elbows are at your sides or slightly behind your body. 8. Hold for __________ seconds. 9. Slowly straighten your arms to return to the starting position. Repeat __________ times. Complete this exercise __________ times a day. Posture and body mechanics Good posture and healthy body mechanics can help to relieve stress in your body's tissues and joints. Body mechanics refers to the movements and positions of your body while you do your daily activities. Posture is part of body mechanics. Good posture means:  Your spine is in its natural S-curve position (neutral).  Your shoulders are pulled back slightly.  Your head is not tipped forward. Follow these guidelines to improve your posture and body mechanics in your everyday  activities. Standing   When standing, keep your spine neutral and your feet about hip width apart. Keep a slight bend in your knees. Your ears, shoulders, and hips should line up with each other.  When you do a task in which you lean forward while standing in one place for a long time, place one foot up on a stable object that is 2-4 inches (5-10 cm) high, such as a footstool. This helps keep your spine neutral. Sitting   When sitting, keep your spine neutral and keep your feet flat on the floor. Use a footrest, if necessary, and keep your thighs parallel to the floor. Avoid rounding your shoulders, and avoid tilting your head forward.  When working at a desk or a computer, keep your desk at a height where your hands are slightly lower than your elbows. Slide your chair under your desk so you are close enough to maintain good posture.  When working at a computer, place your monitor at a height where you are looking straight ahead and you do not have to tilt your head forward or downward to look at the screen. Resting When lying down and resting, avoid positions that are most painful for you.  If you have pain with activities such as sitting, bending, stooping, or squatting (flexion-basedactivities), lie in a position in which your body does not bend very much. For example, avoid curling up on your side with your arms and knees near your chest (fetal position).  If you have pain with activities such as standing for a long time or reaching with your arms (extension-basedactivities), lie with your spine in a neutral position and bend your knees slightly. Try the following positions: ? Lie on your side with a pillow between your knees. ? Lie on your back with a pillow under your knees.  Lifting   When lifting objects, keep your feet at least shoulder width  apart and tighten your abdominal muscles.  Bend your knees and hips and keep your spine neutral. It is important to lift using the  strength of your legs, not your back. Do not lock your knees straight out.  Always ask for help to lift heavy or awkward objects. This information is not intended to replace advice given to you by your health care provider. Make sure you discuss any questions you have with your health care provider. Document Revised: 08/11/2018 Document Reviewed: 05/29/2018 Elsevier Patient Education  [place].

## 2020-03-13 NOTE — Progress Notes (Signed)
Subjective:    Patient ID: Brad Hart, male    DOB: 11-19-1990, 29 y.o.   MRN: [id number]  No chief complaint on file.   HPI Patient is a 29 yo male with pmh sig for b/l carpal tunnel syndrome, HLD, obesity, HTN who was seen for ongoing concern.  Pt endorses continued b/l upper back pain.  Symptoms initially started ~1 yr ago while working at [employer].  Pt notes pain is intense, sharp, burning sensation that comes and goes.  Tried heat, massage roller, ibuprofen 600 mg twice daily.  Pt with weakness in R arm when picking up something heavy like a gallon of milk.  Patient no longer working at [employer].  Denies heavy lifting, pushing pulling, hearing any pop, click, or feeling any tears.  Endorses pulling sensation in upper back with overhead movement.  Past Medical History:  Diagnosis Date  . Hyperlipidemia   . Hypertension     No Known Allergies  ROS General: Denies fever, chills, night sweats, changes in weight, changes in appetite HEENT: Denies headaches, ear pain, changes in vision, rhinorrhea, sore throat CV: Denies CP, palpitations, SOB, orthopnea Pulm: Denies SOB, cough, wheezing GI: Denies abdominal pain, nausea, vomiting, diarrhea, constipation GU: Denies dysuria, hematuria, frequency  Msk: Denies muscle cramps, joint pains  + bilateral upper back pain Neuro: Denies weakness, numbness, tingling Skin: Denies rashes, bruising Psych: Denies depression, anxiety, hallucinations     Objective:    Blood pressure 130/90, pulse 93, temperature 98.2 F (36.8 C), temperature source Oral, weight 254 lb 6.4 oz (115.4 kg), SpO2 98 %.  Gen. Pleasant, well-nourished, obese, in no distress, normal affect   HEENT: [place]/AT, face symmetric, conjunctiva clear, no scleral icterus, PERRLA, EOMI, nares patent without drainage Lungs: no accessory muscle use Cardiovascular: RRR, no m/r/g, no peripheral edema Musculoskeletal: No TTP of cervical, thoracic, lumbar, or sacral spine.  No TTP of  thoracic or lumbar paraspinal muscles.  Patient was not audibly endorse comfort with palpation though made faces with palpation of right rhomboid major and minor.  FROM of b/l UEs-active and passive with discomfort.  Negative empty can, cross arm.  Pain in mid upper back with movement of arm overhead.  No deformities, no cyanosis or clubbing, normal tone Neuro:  A&Ox3, CN II-XII intact, normal gait Skin:  Warm, no lesions/ rash   Wt Readings from Last 3 Encounters:  03/13/20 254 lb 6.4 oz (115.4 kg)  09/03/19 235 lb (106.6 kg)  06/28/19 242 lb (109.8 kg)    Lab Results  Component Value Date   WBC 6.8 07/03/2019   HGB 13.9 07/03/2019   HCT 39.5 07/03/2019   PLT 227.0 07/03/2019   GLUCOSE 99 07/03/2019   CHOL 170 07/03/2019   TRIG 96.0 07/03/2019   HDL 47.30 07/03/2019   LDLDIRECT 101.0 08/22/2017   LDLCALC 104 (H) 07/03/2019   NA 138 07/03/2019   K 4.0 07/03/2019   CL 104 07/03/2019   CREATININE 1.15 07/03/2019   BUN 19 07/03/2019   CO2 27 07/03/2019   TSH 1.73 07/03/2019   HGBA1C 5.1 07/03/2019    Assessment/Plan:  Rhomboid muscle pain -Given duration of symptoms concern for muscle tear -Discussed continuing supportive care.  Okay to take Tylenol or ibuprofen as needed.  Also consider Biofreeze -We will start prednisone taper -Referral placed for PT and Ortho -Patient given precautions - Plan: Ambulatory referral to Physical Therapy, Ambulatory referral to Orthopedic Surgery, predniSONE (DELTASONE) 10 MG tablet  F/u as needed  Abbe Amsterdam,  MD

## 2020-03-14 ENCOUNTER — Ambulatory Visit (INDEPENDENT_AMBULATORY_CARE_PROVIDER_SITE_OTHER): Payer: No Typology Code available for payment source

## 2020-03-14 ENCOUNTER — Ambulatory Visit (INDEPENDENT_AMBULATORY_CARE_PROVIDER_SITE_OTHER): Payer: No Typology Code available for payment source | Admitting: Orthopaedic Surgery

## 2020-03-14 ENCOUNTER — Encounter: Payer: Self-pay | Admitting: Orthopaedic Surgery

## 2020-03-14 DIAGNOSIS — Z6841 Body Mass Index (BMI) 40.0 and over, adult: Secondary | ICD-10-CM | POA: Diagnosis not present

## 2020-03-14 DIAGNOSIS — M542 Cervicalgia: Secondary | ICD-10-CM | POA: Diagnosis not present

## 2020-03-14 NOTE — Progress Notes (Signed)
Office Visit Note   Patient: Brad Hart           Date of Birth: 01/15/1991           MRN: [id number] Visit Date: 03/14/2020              Requested by: Deeann Saint, MD [address] [place],  [place] [address] PCP: Deeann Saint, MD   Assessment & Plan: Visit Diagnoses:  1. Neck pain   2. Cervicalgia   3. Morbid obesity with BMI of 40.0-44.9, adult [facility])     Plan: [name] has been experiencing symptoms off and on in reference to his cervical spine for approximately a year. He will experience occasional neck stiffness with the occasional bilateral arm numbness and tingling and interscapular pain. No history of injury or trauma. 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 Is reached a point where he is having difficulty sleeping and performing some of the activities of daily living. He has tried Tylenol twice a day with Advil and it seems to help. X-rays demonstrated straightening of the normal cervical lordosis but without any other structural changes. I would like to try a course of physical therapy. His primary care physician ordered a Medrol Dosepak yesterday which she will try. He will stop the NSAID while on the Medrol and can try Tylenol. I had like to see him back in the next 3 to 4 weeks. Consider MRI scan if no improvement  Follow-Up Instructions: Return if symptoms worsen or fail to improve.   Orders:  Orders Placed This Encounter  Procedures  . XR Cervical Spine 2 or 3 views  . Ambulatory referral to Physical Therapy   No orders of the defined types were placed in this encounter.     Procedures: No procedures performed   Clinical Data: No additional findings.   Subjective: Chief Complaint  Patient presents with  . Neck - Pain  Insidious onset of neck pain about a year ago without injury or trauma. He has occasional pain in the neck and interscapular region with even less frequent episodes of  bilateral arm numbness and tingling. He has taken Advil up to twice a day which seems to help. Seen by his primary care physician yesterday and ordered Medrol Dosepak  HPI  Review of Systems   Objective: Vital Signs: There were no vitals taken for this visit.  Physical Exam Constitutional:      Appearance: He is well-developed.  Eyes:     Pupils: Pupils are equal, round, and reactive to light.  Pulmonary:     Effort: Pulmonary effort is normal.  Skin:    General: Skin is warm and dry.  Neurological:     Mental Status: He is alert and oriented to person, place, and time.  Psychiatric:        Behavior: Behavior normal.     Ortho Exam awake alert and oriented x3. Comfortable sitting able to touch his chin to his chest and almost full neck extension with very minimal discomfort referred to the interscapular region. No localized areas of tenderness. Normal rotation of the right and to the left without any referred pain to either upper extremity. Good grip and good release. Negative Phalen's and Tinel's over the median nerve. No pain over either ulnar nerve at the elbow. Neurologically intact Specialty Comments:  No specialty comments available.  Imaging: XR Cervical Spine 2 or 3  views  Result Date: 03/14/2020 Films of the cervical spine were obtained in 2 projections. There is straightening of the normal cervical lordosis without any acute changes. Disc spaces are well-maintained. No obvious facet sclerosis    PMFS History: Patient Active Problem List   Diagnosis Date Noted  . Cervicalgia 03/14/2020  . Hypertension, essential, benign 02/15/2017  . Hypertriglyceridemia 02/15/2017  . Morbid obesity with BMI of 40.0-44.9, adult (HCC) 02/15/2017   Past Medical History:  Diagnosis Date  . Hyperlipidemia   . Hypertension     Family History  Adopted: Yes  Problem Relation Age of Onset  . Hemachromatosis Maternal Grandmother   . Heart disease Maternal Grandfather         CAD/MI    History reviewed. No pertinent surgical history. Social History   Occupational History  . Not on file  Tobacco Use  . Smoking status: Former Smoker    Quit date: 09/15/2016    Years since quitting: 3.4  . Smokeless tobacco: Never Used  Substance and Sexual Activity  . Alcohol use: Yes    Comment: OCCASIONAL  . Drug use: No  . Sexual activity: Yes    Birth control/protection: None    Comment: Married     Valeria Batman, MD   Note - This record has been created using [employer].  Chart creation errors have been sought, but may not always  have been located. Such creation errors do not reflect on  the standard of medical care.

## 2020-04-10 ENCOUNTER — Ambulatory Visit: Payer: No Typology Code available for payment source | Admitting: Orthopaedic Surgery

## 2020-04-14 MED FILL — LOSARTAN POTASSIUM 100 MG T: 100 | 90 days supply | Qty: 90 | Fill #2

## 2020-04-14 MED FILL — HYDROCHLOROTHIAZIDE 25 MG T: 25 | 90 days supply | Qty: 90 | Fill #2

## 2020-06-26 ENCOUNTER — Other Ambulatory Visit (HOSPITAL_COMMUNITY): Payer: Self-pay

## 2020-06-27 MED FILL — IBUPROFEN 800 MG TAB: 800 | 8 days supply | Qty: 24 | Fill #0

## 2020-07-14 ENCOUNTER — Other Ambulatory Visit (HOSPITAL_COMMUNITY): Payer: Self-pay

## 2020-08-11 ENCOUNTER — Other Ambulatory Visit (HOSPITAL_COMMUNITY): Payer: Self-pay

## 2020-08-11 MED FILL — Losartan Potassium Tab 100 MG: ORAL | 90 days supply | Qty: 90 | Fill #0 | Status: AC

## 2020-08-11 MED FILL — Hydrochlorothiazide Tab 25 MG: ORAL | 90 days supply | Qty: 90 | Fill #0 | Status: AC

## 2020-10-07 ENCOUNTER — Other Ambulatory Visit (HOSPITAL_COMMUNITY): Payer: Self-pay

## 2020-10-07 MED ORDER — CARESTART COVID-19 HOME TEST VI KIT
PACK | 0 refills | Status: DC
  Filled 2020-10-07: qty 4, 4d supply, fill #0

## 2020-11-27 ENCOUNTER — Other Ambulatory Visit: Payer: Self-pay | Admitting: Family Medicine

## 2020-11-27 ENCOUNTER — Other Ambulatory Visit (HOSPITAL_COMMUNITY): Payer: Self-pay

## 2020-11-27 DIAGNOSIS — I10 Essential (primary) hypertension: Secondary | ICD-10-CM

## 2020-11-27 MED ORDER — HYDROCHLOROTHIAZIDE 25 MG PO TABS
ORAL_TABLET | Freq: Every day | ORAL | 0 refills | Status: DC
  Filled 2020-11-27: qty 90, 90d supply, fill #0

## 2020-11-27 MED ORDER — LOSARTAN POTASSIUM 100 MG PO TABS
ORAL_TABLET | Freq: Every day | ORAL | 0 refills | Status: DC
  Filled 2020-11-27: qty 90, 90d supply, fill #0

## 2020-11-28 ENCOUNTER — Other Ambulatory Visit (HOSPITAL_COMMUNITY): Payer: Self-pay

## 2021-04-08 ENCOUNTER — Other Ambulatory Visit (HOSPITAL_COMMUNITY): Payer: Self-pay

## 2021-04-08 ENCOUNTER — Other Ambulatory Visit: Payer: Self-pay | Admitting: Family Medicine

## 2021-04-08 DIAGNOSIS — I10 Essential (primary) hypertension: Secondary | ICD-10-CM

## 2021-04-08 MED ORDER — LOSARTAN POTASSIUM 100 MG PO TABS
ORAL_TABLET | Freq: Every day | ORAL | 1 refills | Status: DC
  Filled 2021-04-08: qty 30, 30d supply, fill #0
  Filled 2021-05-21: qty 30, 30d supply, fill #1

## 2021-04-08 MED ORDER — HYDROCHLOROTHIAZIDE 25 MG PO TABS
ORAL_TABLET | Freq: Every day | ORAL | 1 refills | Status: DC
  Filled 2021-04-08: qty 30, 30d supply, fill #0
  Filled 2021-05-21: qty 30, 30d supply, fill #1

## 2021-04-30 IMAGING — DX DG WRIST COMPLETE 3+V*R*
4 series · 4 of 4 positions shown · non-contrast
Comparison: None.

CLINICAL DATA: Pain status post fall.

EXAM:
RIGHT WRIST - COMPLETE 3+ VIEW

[wrist pa]
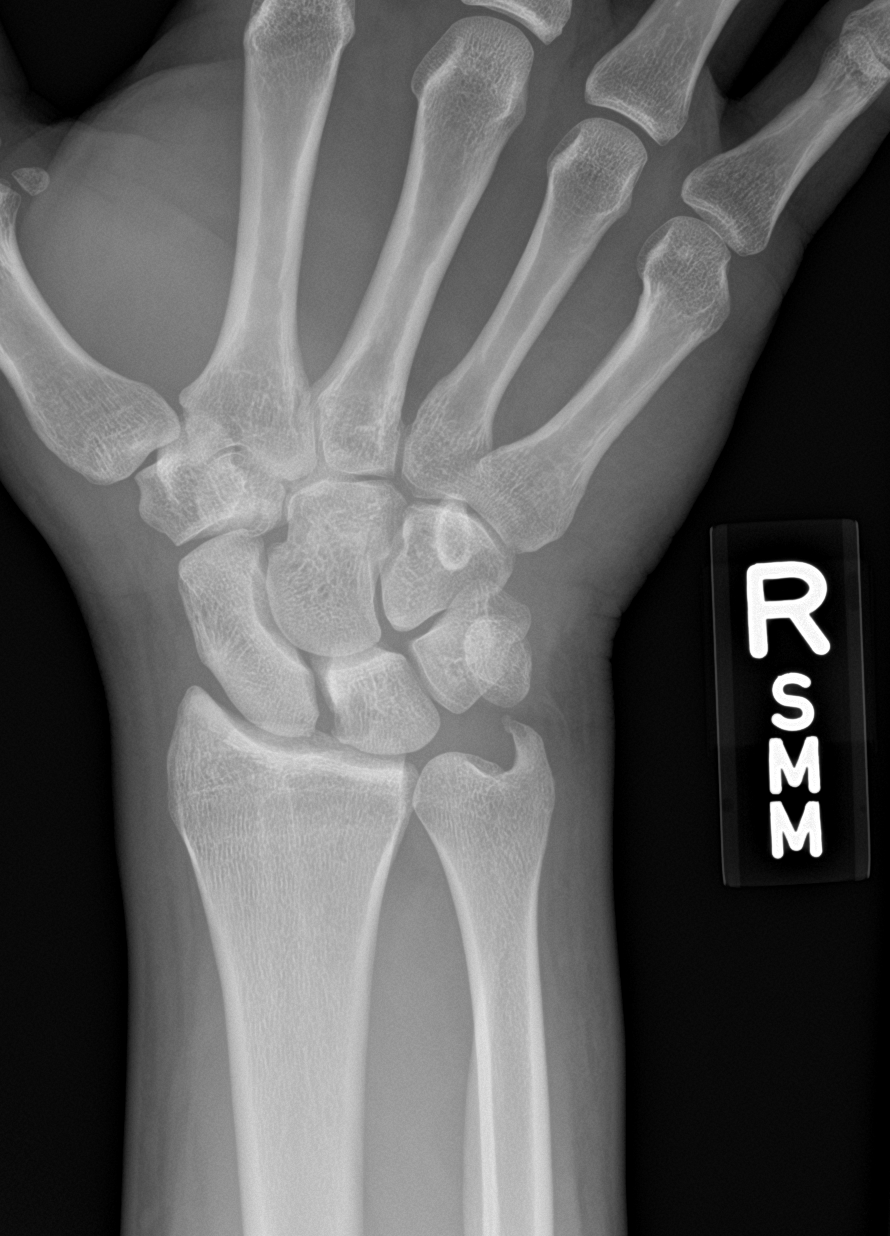

[wrist navicular]
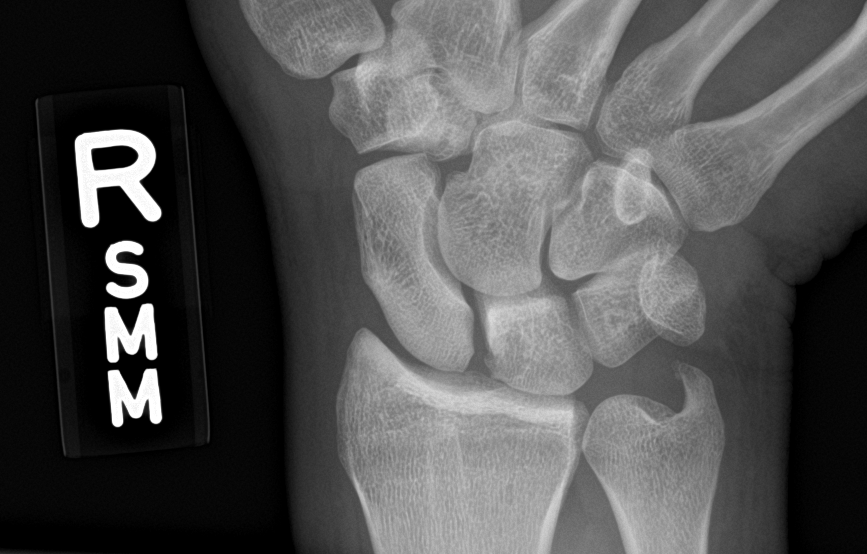

[wrist obl]
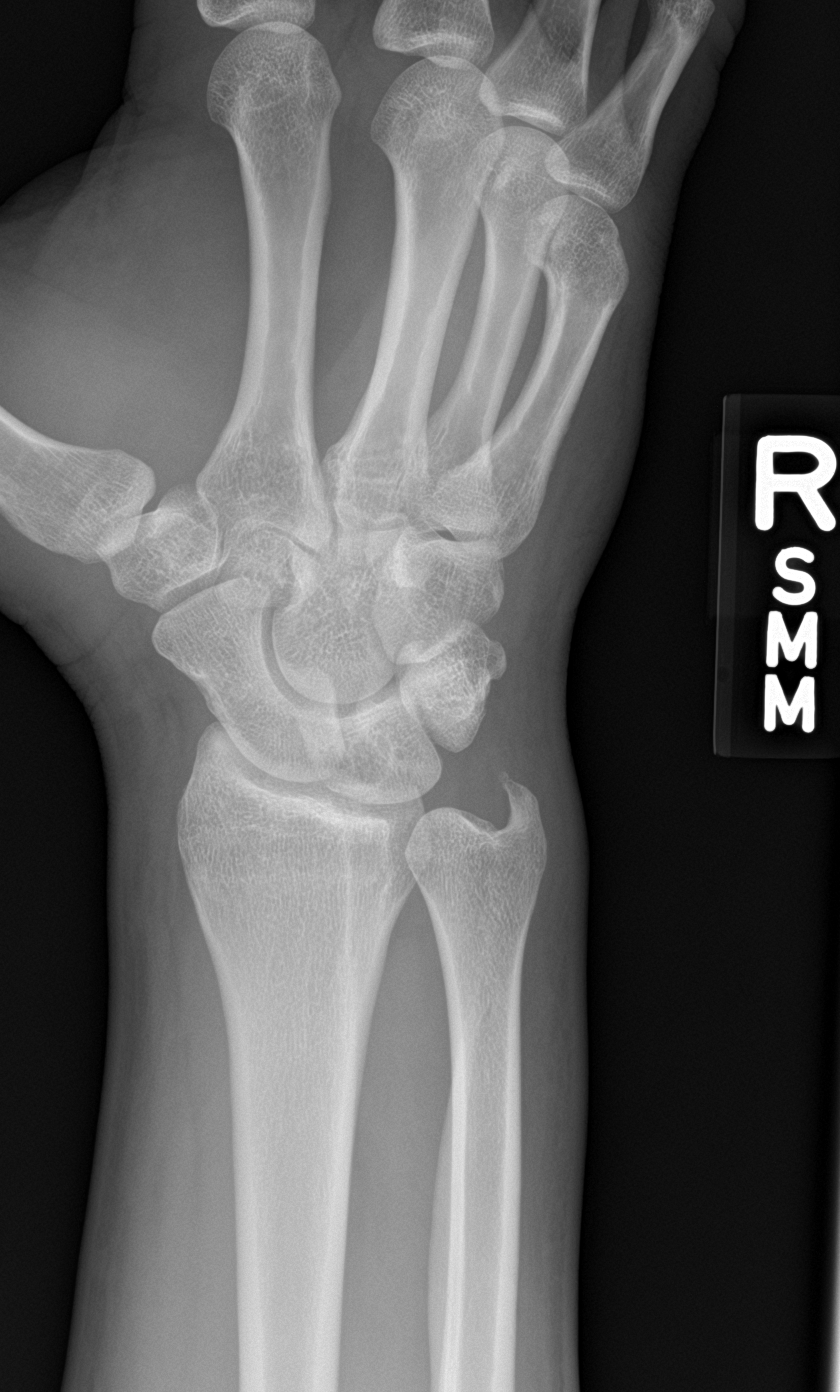

[wrist lat]
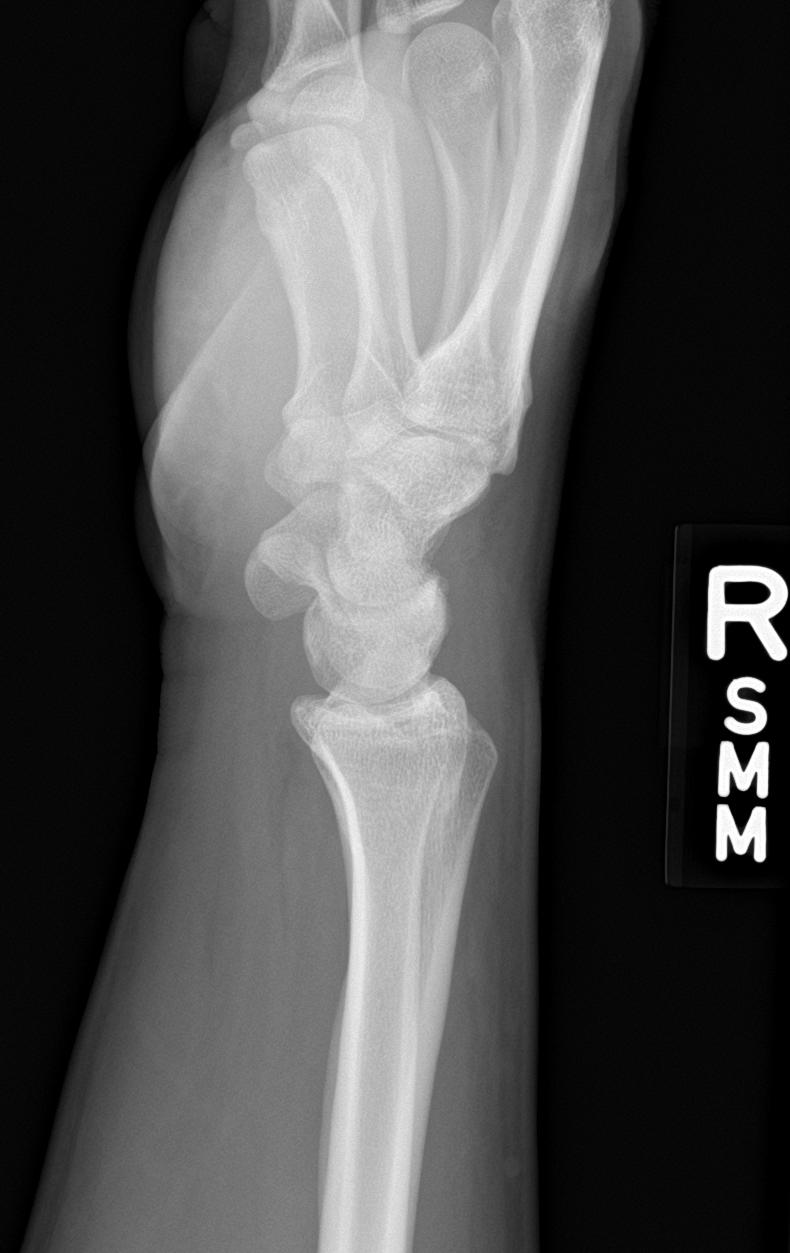

[4 of 4 positions shown; findings below may reference images not displayed]

FINDINGS: There is no evidence of fracture or dislocation. There is no
evidence of arthropathy or other focal bone abnormality. Soft
tissues are unremarkable.
IMPRESSION: Negative.

## 2021-05-21 ENCOUNTER — Other Ambulatory Visit (HOSPITAL_COMMUNITY): Payer: Self-pay

## 2021-07-15 ENCOUNTER — Encounter: Payer: Self-pay | Admitting: Family Medicine

## 2021-07-15 ENCOUNTER — Ambulatory Visit (INDEPENDENT_AMBULATORY_CARE_PROVIDER_SITE_OTHER): Payer: No Typology Code available for payment source | Admitting: Family Medicine

## 2021-07-15 ENCOUNTER — Other Ambulatory Visit (HOSPITAL_COMMUNITY): Payer: Self-pay

## 2021-07-15 VITALS — BP 144/101 | HR 76 | Temp 99.3°F | Wt 255.0 lb

## 2021-07-15 DIAGNOSIS — Z6838 Body mass index (BMI) 38.0-38.9, adult: Secondary | ICD-10-CM

## 2021-07-15 DIAGNOSIS — I10 Essential (primary) hypertension: Secondary | ICD-10-CM | POA: Diagnosis not present

## 2021-07-15 DIAGNOSIS — Z23 Encounter for immunization: Secondary | ICD-10-CM | POA: Diagnosis not present

## 2021-07-15 DIAGNOSIS — R5383 Other fatigue: Secondary | ICD-10-CM

## 2021-07-15 DIAGNOSIS — M791 Myalgia, unspecified site: Secondary | ICD-10-CM | POA: Diagnosis not present

## 2021-07-15 LAB — COMPREHENSIVE METABOLIC PANEL
ALT: 32 U/L (ref 0–53)
AST: 21 U/L (ref 0–37)
Albumin: 4.8 g/dL (ref 3.5–5.2)
Alkaline Phosphatase: 53 U/L (ref 39–117)
BUN: 17 mg/dL (ref 6–23)
CO2: 31 mEq/L (ref 19–32)
Calcium: 10.1 mg/dL (ref 8.4–10.5)
Chloride: 100 mEq/L (ref 96–112)
Creatinine, Ser: 1.11 mg/dL (ref 0.40–1.50)
GFR: 87.87 mL/min (ref >60.00)
Glucose, Bld: 83 mg/dL (ref 70–99)
Potassium: 4.2 mEq/L (ref 3.5–5.1)
Sodium: 138 mEq/L (ref 135–145)
Total Bilirubin: 0.9 mg/dL (ref 0.2–1.2)
Total Protein: 7.6 g/dL (ref 6.0–8.3)

## 2021-07-15 LAB — T4, FREE: Free T4: 0.86 ng/dL (ref 0.60–1.60)

## 2021-07-15 LAB — MAGNESIUM: Magnesium: 2.3 mg/dL (ref 1.5–2.5)

## 2021-07-15 LAB — TSH: TSH: 1.45 u[IU]/mL (ref 0.35–5.50)

## 2021-07-15 LAB — VITAMIN D 25 HYDROXY (VIT D DEFICIENCY, FRACTURES): VITD: 19.15 ng/mL — ABNORMAL LOW (ref 30.00–100.00)

## 2021-07-15 LAB — VITAMIN B12: Vitamin B-12: 369 pg/mL (ref 211–911)

## 2021-07-15 MED ORDER — LOSARTAN POTASSIUM 100 MG PO TABS
100.0000 mg | ORAL_TABLET | Freq: Every day | ORAL | 3 refills | Status: DC
  Filled 2021-07-15: qty 90, 90d supply, fill #0
  Filled 2021-11-05: qty 90, 90d supply, fill #1
  Filled 2022-02-21: qty 90, 90d supply, fill #2
  Filled 2022-06-07: qty 90, 90d supply, fill #3

## 2021-07-15 MED ORDER — HYDROCHLOROTHIAZIDE 25 MG PO TABS
25.0000 mg | ORAL_TABLET | Freq: Every day | ORAL | 3 refills | Status: DC
  Filled 2021-07-15: qty 90, 90d supply, fill #0
  Filled 2021-11-05: qty 90, 90d supply, fill #1
  Filled 2022-02-21: qty 90, 90d supply, fill #2
  Filled 2022-06-07: qty 90, 90d supply, fill #3

## 2021-07-15 NOTE — Progress Notes (Signed)
Subjective:  ? ? Patient ID: Brad Hart, male    DOB: 06-22-1990, 31 y.o.   MRN: [id number] ? ?Chief Complaint  ?Patient presents with  ? Follow-up  ?  BP meds  ? ? ?HPI ?Patient is a 31 yo male with HTN, lost to f/u, who was seen today for HTN.  Pt states he has been doing well.  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  Previously on losartan 100 mg and HCTZ 25 mg daily.  Mostly eating at home.  May have fast food once or twice per wk.  Denies HAs, CP, changes in vision, LE edema.  Pt may have mild edema at the end of the day in [place].  Notices occasional R calf soreness in am.  At times feels like he needs to move his legs at night when trying to sleep.  Pt endorses waking up feeling tire.  Does not snore often.  In bed by 10 and up around 5 or 6 am.   ? ?Past Medical History:  ?Diagnosis Date  ? Hyperlipidemia   ? Hypertension   ? ? ?No Known Allergies ? ?ROS ?General: Denies fever, chills, night sweats, changes in weight, changes in appetite +fatigue ?HEENT: Denies headaches, ear pain, changes in vision, rhinorrhea, sore throat ?CV: Denies CP, palpitations, SOB, orthopnea ?Pulm: Denies SOB, cough, wheezing ?GI: Denies abdominal pain, nausea, vomiting, diarrhea, constipation ?GU: Denies dysuria, hematuria, frequency ?Msk: Denies muscle cramps, joint pains  +R calf soreness ?Neuro: Denies weakness, numbness, tingling ?Skin: Denies rashes, bruising ?Psych: Denies depression, anxiety, hallucinations ? ?   ?Objective:  ?  ?Blood pressure (!) 144/101, pulse 76, temperature 99.3 ?F (37.4 ?C), temperature source Oral, weight 255 lb (115.7 kg), SpO2 98 %. Body mass index is 38.77 kg/m?. ? ?Gen. Pleasant, well-nourished, in no distress, normal affect   ?HEENT: [place]/AT, face symmetric, conjunctiva clear, no scleral icterus, PERRLA, EOMI, nares patent without drainage ?Lungs: no accessory muscle use, CTAB, no wheezes or rales ?Cardiovascular: RRR, no m/r/g, trace edema of b/l ankles.  No calf tenderness,  negative homans' sign b/l, no calf edema. ?Musculoskeletal: No deformities, no cyanosis or clubbing, normal tone ?Neuro:  A&Ox3, CN II-XII intact, normal gait ?Skin:  Warm, no lesions/ rash ? ? ?Wt Readings from Last 3 Encounters:  ?07/15/21 255 lb (115.7 kg)  ?03/13/20 254 lb 6.4 oz (115.4 kg)  ?09/03/19 235 lb (106.6 kg)  ? ? ?Lab Results  ?Component Value Date  ? WBC 6.8 07/03/2019  ? HGB 13.9 07/03/2019  ? HCT 39.5 07/03/2019  ? PLT 227.0 07/03/2019  ? GLUCOSE 99 07/03/2019  ? CHOL 170 07/03/2019  ? TRIG 96.0 07/03/2019  ? HDL 47.30 07/03/2019  ? LDLDIRECT 101.0 08/22/2017  ? LDLCALC 104 (H) 07/03/2019  ? NA 138 07/03/2019  ? K 4.0 07/03/2019  ? CL 104 07/03/2019  ? CREATININE 1.15 07/03/2019  ? BUN 19 07/03/2019  ? CO2 27 07/03/2019  ? TSH 1.73 07/03/2019  ? HGBA1C 5.1 07/03/2019  ? ? ?Assessment/Plan: ? ?Essential hypertension  ?-uncontrolled ?-restart losartan 100 mg and hctz 25 mg daily ?-continue lifestyle modifications ?- Plan: CMP, hydrochlorothiazide (HYDRODIURIL) 25 MG tablet, losartan (COZAAR) 100 MG tablet, Ambulatory referral to Sleep Studies ? ?Fatigue, unspecified type ?-consider vitamin or electrolyte def., OSA ?-labs ?-sleep study ? - Plan: Vitamin D, 25-hydroxy, TSH, T4, Free, Vitamin B12, Ambulatory referral to Sleep Studies ? ?Muscle soreness ?-discussed possible causes including RLS, electrolyte def. ?-discussed wearing supportive shoes/obtaining new  insole ?-obtain labs ?- Plan: CMP, TSH, T4, Free, Magnesium ? ?Class 2 severe obesity due to excess calories with serious comorbidity and body mass index (BMI) of 38.0 to 38.9 in adult [facility]) ? ?Need for influenza vaccination ?-influenza vaccine given this visit. ? ?F/u in 2-3 months ? ?Grier Mitts, MD ?

## 2021-07-16 ENCOUNTER — Other Ambulatory Visit (HOSPITAL_COMMUNITY): Payer: Self-pay

## 2021-07-16 ENCOUNTER — Other Ambulatory Visit: Payer: Self-pay | Admitting: Family Medicine

## 2021-07-16 MED ORDER — VITAMIN D (ERGOCALCIFEROL) 1.25 MG (50000 UNIT) PO CAPS
50000.0000 [IU] | ORAL_CAPSULE | ORAL | 0 refills | Status: DC
  Filled 2021-07-16: qty 12, 84d supply, fill #0

## 2021-10-15 ENCOUNTER — Ambulatory Visit (INDEPENDENT_AMBULATORY_CARE_PROVIDER_SITE_OTHER): Payer: No Typology Code available for payment source | Admitting: Family Medicine

## 2021-10-15 ENCOUNTER — Encounter: Payer: Self-pay | Admitting: Family Medicine

## 2021-10-15 VITALS — BP 138/87 | HR 85 | Temp 98.4°F | Ht 68.0 in | Wt 267.2 lb

## 2021-10-15 DIAGNOSIS — I10 Essential (primary) hypertension: Secondary | ICD-10-CM | POA: Diagnosis not present

## 2021-10-15 DIAGNOSIS — E559 Vitamin D deficiency, unspecified: Secondary | ICD-10-CM

## 2021-10-15 NOTE — Progress Notes (Signed)
Subjective:    Patient ID: Brad Hart, male    DOB: February 10, 1991, 31 y.o.   MRN: [id number]  Chief Complaint  Patient presents with   Follow-up  Pt accompanied by his two daughters ages 47 and 65.  HPI Patient was seen today for f/u on HTN.  Pt states he has been feeling good.  Has not yet obtain BP cuff to monitor BP at home.  Patient is headaches, CP, changes in vision.  Taking HCTZ 25 mg daily and losartan 100 mg daily.  Patient also completed ergocalciferol 50,000 IUs x 12 weeks.  Patient states he might of noticed slight improvement in fatigue.  States his wife noticed more of an improvement in him overall.  Past Medical History:  Diagnosis Date   Hyperlipidemia    Hypertension     No Known Allergies  ROS General: Denies fever, chills, night sweats, changes in weight, changes in appetite HEENT: Denies headaches, ear pain, changes in vision, rhinorrhea, sore throat CV: Denies CP, palpitations, SOB, orthopnea Pulm: Denies SOB, cough, wheezing GI: Denies abdominal pain, nausea, vomiting, diarrhea, constipation GU: Denies dysuria, hematuria, frequency Msk: Denies muscle cramps, joint pains Neuro: Denies weakness, numbness, tingling Skin: Denies rashes, bruising Psych: Denies depression, anxiety, hallucinations     Objective:    Blood pressure 138/87, pulse 85, temperature 98.4 F (36.9 C), temperature source Oral, height 5\' 8"  (1.727 m), weight 267 lb 3.2 oz (121.2 kg), SpO2 98 %.  Gen. Pleasant, well-nourished, in no distress, normal affect   HEENT: [place]/AT, face symmetric, conjunctiva clear, no scleral icterus, PERRLA, EOMI, nares patent without drainage. Lungs: no accessory muscle use, CTAB, no wheezes or rales Cardiovascular: RRR, no m/r/g, no peripheral edema Musculoskeletal: No deformities, no cyanosis or clubbing, normal tone Neuro:  A&Ox3, CN II-XII intact, normal gait Skin:  Warm, no lesions/ rash   Wt Readings from Last 3 Encounters:  10/15/21 267 lb 3.2 oz  (121.2 kg)  07/15/21 255 lb (115.7 kg)  03/13/20 254 lb 6.4 oz (115.4 kg)    Lab Results  Component Value Date   WBC 6.8 07/03/2019   HGB 13.9 07/03/2019   HCT 39.5 07/03/2019   PLT 227.0 07/03/2019   GLUCOSE 83 07/15/2021   CHOL 170 07/03/2019   TRIG 96.0 07/03/2019   HDL 47.30 07/03/2019   LDLDIRECT 101.0 08/22/2017   LDLCALC 104 (H) 07/03/2019   ALT 32 07/15/2021   AST 21 07/15/2021   NA 138 07/15/2021   K 4.2 07/15/2021   CL 100 07/15/2021   CREATININE 1.11 07/15/2021   BUN 17 07/15/2021   CO2 31 07/15/2021   TSH 1.45 07/15/2021   HGBA1C 5.1 07/03/2019    Assessment/Plan:  Essential hypertension -BP initially elevated at 136/96 -Recheck 138/87 -Continue lifestyle modifications -Continue current medications including HCTZ 25 mg daily and losartan 100 mg daily -Patient encouraged to obtain BP cuff to monitor BP at home.  Advised to contact clinic for elevations consistently greater than 140/90.  Vitamin D deficiency -Vitamin D 19.15 on 07/15/2021 -Course of ergocalciferol 50,000 IU weekly x12 weeks completed -Patient to start OTC vitamin D 1000-2000 IUs daily -Recheck vitamin D at next OFV.  F/u in 3-4 months, sooner if needed  07/17/2021, MD

## 2021-11-06 ENCOUNTER — Other Ambulatory Visit (HOSPITAL_COMMUNITY): Payer: Self-pay

## 2022-02-22 ENCOUNTER — Other Ambulatory Visit (HOSPITAL_COMMUNITY): Payer: Self-pay

## 2022-03-10 ENCOUNTER — Telehealth (INDEPENDENT_AMBULATORY_CARE_PROVIDER_SITE_OTHER): Payer: No Typology Code available for payment source | Admitting: Family Medicine

## 2022-03-10 ENCOUNTER — Other Ambulatory Visit (HOSPITAL_COMMUNITY): Payer: Self-pay

## 2022-03-10 ENCOUNTER — Encounter: Payer: Self-pay | Admitting: Family Medicine

## 2022-03-10 DIAGNOSIS — J3489 Other specified disorders of nose and nasal sinuses: Secondary | ICD-10-CM

## 2022-03-10 DIAGNOSIS — R052 Subacute cough: Secondary | ICD-10-CM | POA: Diagnosis not present

## 2022-03-10 DIAGNOSIS — J0100 Acute maxillary sinusitis, unspecified: Secondary | ICD-10-CM | POA: Diagnosis not present

## 2022-03-10 MED ORDER — BENZONATATE 200 MG PO CAPS
200.0000 mg | ORAL_CAPSULE | Freq: Two times a day (BID) | ORAL | 0 refills | Status: DC | PRN
  Filled 2022-03-10: qty 20, 10d supply, fill #0

## 2022-03-10 MED ORDER — AMOXICILLIN-POT CLAVULANATE 875-125 MG PO TABS
1.0000 | ORAL_TABLET | Freq: Two times a day (BID) | ORAL | 0 refills | Status: DC
  Filled 2022-03-10: qty 20, 10d supply, fill #0

## 2022-03-10 NOTE — Progress Notes (Signed)
Virtual Visit via Video Note  I connected with Brad Hart on 03/10/22 at 11:30 AM EST by a video enabled telemedicine application 2/2 COVID-19 pandemic and verified that I am speaking with the correct person using two identifiers.  Location patient: home Location provider:work or home office Persons participating in the virtual visit: patient, provider  I discussed the limitations of evaluation and management by telemedicine and the availability of in person appointments. The patient expressed understanding and agreed to proceed.  Chief Complaint  Patient presents with   Nasal Congestion    Respiratory and nasal issues for a month, has only used dayquil and some nasal spray.     HPI: Pt with a deep cough x several wks.  Symptoms come and go.  Also with nasal drainage, stuffiness, rhinorrhea.  Subjective fever x 3 days, chills, whoozy, facial pain/pressure, HA.  Had ST when symptoms started, but went away a few wks ago. Had nausea this am.  Tried dayquil and nasal spray.   ROS: See pertinent positives and negatives per HPI.  Past Medical History:  Diagnosis Date   Hyperlipidemia    Hypertension     No past surgical history on file.  Family History  Adopted: Yes  Problem Relation Age of Onset   Hemachromatosis Maternal Grandmother    Heart disease Maternal Grandfather        CAD/MI      Current Outpatient Medications:    amoxicillin-clavulanate (AUGMENTIN) 875-125 MG tablet, Take 1 tablet by mouth 2 (two) times daily., Disp: 20 tablet, Rfl: 0   benzonatate (TESSALON) 200 MG capsule, Take 1 capsule (200 mg total) by mouth 2 (two) times daily as needed for cough., Disp: 20 capsule, Rfl: 0   hydrochlorothiazide (HYDRODIURIL) 25 MG tablet, Take 1 tablet (25 mg total) by mouth daily., Disp: 90 tablet, Rfl: 3   losartan (COZAAR) 100 MG tablet, Take 1 tablet (100 mg total) by mouth daily., Disp: 90 tablet, Rfl: 3   Vitamin D, Ergocalciferol, (DRISDOL) 1.25 MG (50000 UNIT) CAPS  capsule, Take 1 capsule (50,000 Units total) by mouth every 7 (seven) days., Disp: 12 capsule, Rfl: 0  EXAM:  VITALS per patient if applicable:  RR between 12-20 bpm  GENERAL: alert, oriented, appears well and in no acute distress  HEENT: atraumatic, conjunctiva clear, no obvious abnormalities on inspection of external nose and ears  NECK: normal movements of the head and neck  LUNGS: on inspection no signs of respiratory distress, breathing rate appears normal, no obvious gross SOB, gasping or wheezing  CV: no obvious cyanosis  MS: moves all visible extremities without noticeable abnormality  PSYCH/NEURO: pleasant and cooperative, no obvious depression or anxiety, speech and thought processing grossly intact  ASSESSMENT AND PLAN:  Discussed the following assessment and plan:  Subacute maxillary sinusitis  - Plan: amoxicillin-clavulanate (AUGMENTIN) 875-125 MG tablet  Subacute cough  - Plan: benzonatate (TESSALON) 200 MG capsule  Nasal drainage  Continued upper respiratory symptoms x wks, likely started as viral etiology however concern for sinusitis.  Will start ABX, Augmentin twice daily x7 days.  Continue supportive care with OTC cough/cold medication, saline nasal rinse, Flonase nasal spray.  Rx for Tessalon also sent to pharmacy.  Given precautions   Follow-up as needed  I discussed the assessment and treatment plan with the patient. The patient was provided an opportunity to ask questions and all were answered. The patient agreed with the plan and demonstrated an understanding of the instructions.   The patient was advised to  call back or seek an in-person evaluation if the symptoms worsen or if the condition fails to improve as anticipated.   Billie Ruddy, MD

## 2022-03-17 ENCOUNTER — Other Ambulatory Visit (HOSPITAL_COMMUNITY): Payer: Self-pay

## 2022-03-17 ENCOUNTER — Ambulatory Visit (INDEPENDENT_AMBULATORY_CARE_PROVIDER_SITE_OTHER): Payer: No Typology Code available for payment source | Admitting: Family Medicine

## 2022-03-17 ENCOUNTER — Encounter: Payer: Self-pay | Admitting: Family Medicine

## 2022-03-17 VITALS — BP 122/86 | HR 71 | Temp 98.0°F | Wt 263.0 lb

## 2022-03-17 DIAGNOSIS — F320 Major depressive disorder, single episode, mild: Secondary | ICD-10-CM

## 2022-03-17 DIAGNOSIS — I10 Essential (primary) hypertension: Secondary | ICD-10-CM

## 2022-03-17 DIAGNOSIS — R5383 Other fatigue: Secondary | ICD-10-CM

## 2022-03-17 DIAGNOSIS — R0982 Postnasal drip: Secondary | ICD-10-CM

## 2022-03-17 DIAGNOSIS — R052 Subacute cough: Secondary | ICD-10-CM

## 2022-03-17 MED ORDER — SERTRALINE HCL 25 MG PO TABS
25.0000 mg | ORAL_TABLET | Freq: Every day | ORAL | 3 refills | Status: DC
  Filled 2022-03-17: qty 30, 30d supply, fill #0

## 2022-03-17 NOTE — Patient Instructions (Signed)
Prescription for Zoloft 25 mg was sent to your pharmacy.  You can start taking this daily to see if you notice improvement in symptoms though it may take 4-6 weeks before you notice the full effect at this dose.  We will have you follow-up in the next 4 to 6 weeks to see how you are doing.  Of course you can let us know sooner if you are having any issues with the medication.  Consider using an antihistamine such as Zyrtec, Claritin, Xyzal, or Allegra to help with the drainage that you are having.  Or you can use saline nasal rinse or Flonase nasal spray which are also effective.  Of course let us know if you are having prolonged or worsening symptoms.

## 2022-03-17 NOTE — Progress Notes (Signed)
Subjective:    Patient ID: Brad Hart, male    DOB: 1990/07/13, 31 y.o.   MRN: [id number]  Chief Complaint  Patient presents with   Follow-up    BP    HPI Patient was seen today for continued symptoms.  Pt seen 03/10/2022 virtually for cough times several weeks.  Given Augmentin for sinusitis.  Patient notes improvement in symptoms a few days after starting medication.  Still having cough and nasal drainage.  Patient also seen for follow-up on BP.  Unable to check at home.  Denies headaches from elevated BP.  Taking HCTZ 25 mg and losartan 100 mg.  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  Pt with decreased appetite and decreased energy.  States sleep was okay for the most part.  Pt's daughters sleep in the bed with he and his wife.  Depression symptoms have been ongoing for a while now, started prior to daylight savings.  Pt with h/o vit D def, taking OTC Vit D.  Past Medical History:  Diagnosis Date   Hyperlipidemia    Hypertension     No Known Allergies  ROS General: Denies fever, chills, night sweats, changes in weight +changes in appetite, decreased energy HEENT: Denies headaches, ear pain, changes in vision, rhinorrhea, sore throat+nasal drainage CV: Denies CP, palpitations, SOB, orthopnea Pulm: Denies SOB, wheezing +cough GI: Denies abdominal pain, nausea, vomiting, diarrhea, constipation GU: Denies dysuria, hematuria, frequency Msk: Denies muscle cramps, joint pains Neuro: Denies weakness, numbness, tingling Skin: Denies rashes, bruising Psych: Denies depression, anxiety, hallucinations  +depression     Objective:    Blood pressure 122/86, pulse 71, temperature 98 F (36.7 C), temperature source Oral, weight 263 lb (119.3 kg), SpO2 98 %.   Gen. Pleasant, well-nourished, in no distress, normal affect   HEENT: [place]/AT, face symmetric, conjunctiva clear, no scleral icterus, PERRLA, EOMI, nares patent without  drainage, pharynx without erythema or exudate. Lungs: no accessory muscle use, CTAB, no wheezes or rales Cardiovascular: RRR, no m/r/g, no peripheral edema Musculoskeletal: No deformities, no cyanosis or clubbing, normal tone Neuro:  A&Ox3, CN II-XII intact, normal gait Skin:  Warm, no lesions/ rash   Wt Readings from Last 3 Encounters:  03/17/22 263 lb (119.3 kg)  10/15/21 267 lb 3.2 oz (121.2 kg)  07/15/21 255 lb (115.7 kg)    Lab Results  Component Value Date   WBC 6.8 07/03/2019   HGB 13.9 07/03/2019   HCT 39.5 07/03/2019   PLT 227.0 07/03/2019   GLUCOSE 83 07/15/2021   CHOL 170 07/03/2019   TRIG 96.0 07/03/2019   HDL 47.30 07/03/2019   LDLDIRECT 101.0 08/22/2017   LDLCALC 104 (H) 07/03/2019   ALT 32 07/15/2021   AST 21 07/15/2021   NA 138 07/15/2021   K 4.2 07/15/2021   CL 100 07/15/2021   CREATININE 1.11 07/15/2021   BUN 17 07/15/2021   CO2 31 07/15/2021   TSH 1.45 07/15/2021   HGBA1C 5.1 07/03/2019      03/17/2022   10:02 AM 07/15/2021    1:13 PM  Depression screen PHQ 2/9  Decreased Interest 1 0  Down, Depressed, Hopeless 1 0  PHQ - 2 Score 2 0  Altered sleeping 1 1  Tired, decreased energy 1 1  Change in appetite 2 1  Feeling bad or failure about yourself  2 0  Trouble concentrating 1 0  Moving slowly or fidgety/restless 0 0  Suicidal thoughts 0 0  PHQ-9 Score 9 3  Difficult doing work/chores Somewhat difficult     Assessment/Plan:  Depression, major, single episode, mild (HCC)  -PHQ9 score 9 this visit -Discussed various treatment options including counseling and medication -Will start Zoloft 25 mg daily - Plan: sertraline (ZOLOFT) 25 MG tablet  Subacute cough -Likely postviral versus allergy related due to postnasal drainage -OTC antihistamine -Continue to monitor.  Obtain CXR for continued or worsening symptoms  Post-nasal drainage -OTC antihistamine and/or saline nasal rinse, Flonase  Fatigue, unspecified type -Possibly 2/2 recent  illness versus current depression symptoms -Self-care and lifestyle modifications encouraged  Essential hypertension -Controlled -Continue current medications including hydrochlorothiazide 25 mg daily and losartan 100 mg daily -Continue lifestyle modifications  F/u 4-6 weeks  Abbe Amsterdam, MD

## 2022-04-05 NOTE — Progress Notes (Incomplete)
Subjective:    Patient ID: Brad Hart, male    DOB: 07-08-1990, 31 y.o.   MRN: [id number]  Chief Complaint  Patient presents with  . Follow-up    BP    HPI Patient was seen today for continued symptoms.  Pt seen 03/10/2022 virtually for cough times several weeks.  Given Augmentin for sinusitis.  Patient notes improvement in symptoms a few days after starting medication.  Still having cough and nasal drainage.  Patient also seen for follow-up on BP.  Unable to check at home.  Denies headaches from elevated BP.  Taking HCTZ 25 mg and losartan 100 mg.  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  Pt with decreased appetite and decreased energy.  States sleep was okay for the most part.  Pt's daughters sleep in the bed with he and his wife.  Depression symptoms have been ongoing for a while now, started prior to daylight savings.  Pt with h/o vit D def, taking OTC Vit D.  Past Medical History:  Diagnosis Date  . Hyperlipidemia   . Hypertension     No Known Allergies  ROS General: Denies fever, chills, night sweats, changes in weight +changes in appetite, decreased energy HEENT: Denies headaches, ear pain, changes in vision, rhinorrhea, sore throat+nasal drainage CV: Denies CP, palpitations, SOB, orthopnea Pulm: Denies SOB, wheezing +cough GI: Denies abdominal pain, nausea, vomiting, diarrhea, constipation GU: Denies dysuria, hematuria, frequency Msk: Denies muscle cramps, joint pains Neuro: Denies weakness, numbness, tingling Skin: Denies rashes, bruising Psych: Denies depression, anxiety, hallucinations  +depression     Objective:    Blood pressure 122/86, pulse 71, temperature 98 F (36.7 C), temperature source Oral, weight 263 lb (119.3 kg), SpO2 98 %.   Gen. Pleasant, well-nourished, in no distress, normal affect   HEENT: [place]/AT, face symmetric, conjunctiva clear, no scleral icterus, PERRLA, EOMI, nares patent without  drainage, pharynx without erythema or exudate. Lungs: no accessory muscle use, CTAB, no wheezes or rales Cardiovascular: RRR, no m/r/g, no peripheral edema Musculoskeletal: No deformities, no cyanosis or clubbing, normal tone Neuro:  A&Ox3, CN II-XII intact, normal gait Skin:  Warm, no lesions/ rash   Wt Readings from Last 3 Encounters:  03/17/22 263 lb (119.3 kg)  10/15/21 267 lb 3.2 oz (121.2 kg)  07/15/21 255 lb (115.7 kg)    Lab Results  Component Value Date   WBC 6.8 07/03/2019   HGB 13.9 07/03/2019   HCT 39.5 07/03/2019   PLT 227.0 07/03/2019   GLUCOSE 83 07/15/2021   CHOL 170 07/03/2019   TRIG 96.0 07/03/2019   HDL 47.30 07/03/2019   LDLDIRECT 101.0 08/22/2017   LDLCALC 104 (H) 07/03/2019   ALT 32 07/15/2021   AST 21 07/15/2021   NA 138 07/15/2021   K 4.2 07/15/2021   CL 100 07/15/2021   CREATININE 1.11 07/15/2021   BUN 17 07/15/2021   CO2 31 07/15/2021   TSH 1.45 07/15/2021   HGBA1C 5.1 07/03/2019      03/17/2022   10:02 AM 07/15/2021    1:13 PM  Depression screen PHQ 2/9  Decreased Interest 1 0  Down, Depressed, Hopeless 1 0  PHQ - 2 Score 2 0  Altered sleeping 1 1  Tired, decreased energy 1 1  Change in appetite 2 1  Feeling bad or failure about yourself  2 0  Trouble concentrating 1 0  Moving slowly or fidgety/restless 0 0  Suicidal thoughts 0 0  PHQ-9 Score 9 3  Difficult doing work/chores Somewhat difficult     Assessment/Plan:  Depression, major, single episode, mild (HCC)  -PHQ9 score 9 this visit -Discussed various treatment options including counseling and medication -Will start Zoloft 25 mg daily - Plan: sertraline (ZOLOFT) 25 MG tablet  Subacute cough  Post-nasal drainage  Fatigue, unspecified type  Essential hypertension  F/u ***  Abbe Amsterdam, MD

## 2022-04-14 ENCOUNTER — Other Ambulatory Visit (HOSPITAL_COMMUNITY): Payer: Self-pay

## 2022-04-14 ENCOUNTER — Ambulatory Visit (INDEPENDENT_AMBULATORY_CARE_PROVIDER_SITE_OTHER): Payer: No Typology Code available for payment source | Admitting: Family Medicine

## 2022-04-14 ENCOUNTER — Encounter: Payer: Self-pay | Admitting: Family Medicine

## 2022-04-14 VITALS — BP 122/84 | HR 65 | Temp 98.1°F | Wt 259.8 lb

## 2022-04-14 DIAGNOSIS — F419 Anxiety disorder, unspecified: Secondary | ICD-10-CM

## 2022-04-14 DIAGNOSIS — F320 Major depressive disorder, single episode, mild: Secondary | ICD-10-CM

## 2022-04-14 DIAGNOSIS — I10 Essential (primary) hypertension: Secondary | ICD-10-CM | POA: Diagnosis not present

## 2022-04-14 DIAGNOSIS — Z23 Encounter for immunization: Secondary | ICD-10-CM | POA: Diagnosis not present

## 2022-04-14 MED ORDER — SERTRALINE HCL 25 MG PO TABS
25.0000 mg | ORAL_TABLET | Freq: Every day | ORAL | 1 refills | Status: DC
  Filled 2022-04-14: qty 90, 90d supply, fill #0

## 2022-04-14 NOTE — Patient Instructions (Signed)
You can schedule follow-up/physical in 4-6 months.

## 2022-04-14 NOTE — Progress Notes (Signed)
Subjective:    Patient ID: Brad Hart, male    DOB: [birth year], 31 y.o.   MRN: [id number]  Chief Complaint  Patient presents with   Follow-up    Pt is here to follow up on Zoloft and BP.     HPI Patient is a 31 yo male with pmh sig for HTN, HLD, obesity, anxiety, depression who was seen today for f/u.  Pt notes improvement in anxiety and depression symptoms since starting zoloft 25 mg daily.  Sleeping better.  Remembering dreams.  Waking up feeling rested.  Notes increase energy and interest in doing things.  Appetite has always been fine.  Denies s/e's from the med.  Taking losartan 100 mg and HCTZ 25 mg for HTN.  Denies HAs, LE edema, CP.  Past Medical History:  Diagnosis Date   Hyperlipidemia    Hypertension     No Known Allergies  ROS General: Denies fever, chills, night sweats, changes in weight, changes in appetite +increased energy HEENT: Denies headaches, ear pain, changes in vision, rhinorrhea, sore throat CV: Denies CP, palpitations, SOB, orthopnea Pulm: Denies SOB, cough, wheezing GI: Denies abdominal pain, nausea, vomiting, diarrhea, constipation GU: Denies dysuria, hematuria, frequency Msk: Denies muscle cramps, joint pains Neuro: Denies weakness, numbness, tingling Skin: Denies rashes, bruising Psych: Denies depression, anxiety, hallucinations  +anxiety, depression     Objective:    Blood pressure 122/84, pulse 65, temperature 98.1 F (36.7 C), temperature source Oral, weight 259 lb 12.8 oz (117.8 kg), SpO2 98 %.  Gen. Pleasant, well-nourished, in no distress, normal affect   HEENT: [place]/AT, face symmetric, conjunctiva clear, no scleral icterus, PERRLA, EOMI, nares patent without drainage Lungs: no accessory muscle use, CTAB, no wheezes or rales Cardiovascular: RRR, no m/r/g, no peripheral edema Neuro:  A&Ox3, CN II-XII intact, normal gait Skin:  Warm, no lesions/ rash   Wt Readings from Last 3 Encounters:  04/14/22 259 lb 12.8 oz (117.8 kg)   03/17/22 263 lb (119.3 kg)  10/15/21 267 lb 3.2 oz (121.2 kg)    Lab Results  Component Value Date   WBC 6.8 07/03/2019   HGB 13.9 07/03/2019   HCT 39.5 07/03/2019   PLT 227.0 07/03/2019   GLUCOSE 83 07/15/2021   CHOL 170 07/03/2019   TRIG 96.0 07/03/2019   HDL 47.30 07/03/2019   LDLDIRECT 101.0 08/22/2017   LDLCALC 104 (H) 07/03/2019   ALT 32 07/15/2021   AST 21 07/15/2021   NA 138 07/15/2021   K 4.2 07/15/2021   CL 100 07/15/2021   CREATININE 1.11 07/15/2021   BUN 17 07/15/2021   CO2 31 07/15/2021   TSH 1.45 07/15/2021   HGBA1C 5.1 07/03/2019      04/14/2022   11:28 AM 03/17/2022   10:02 AM 07/15/2021    1:13 PM  Depression screen PHQ 2/9  Decreased Interest 0 1 0  Down, Depressed, Hopeless 0 1 0  PHQ - 2 Score 0 2 0  Altered sleeping 0 1 1  Tired, decreased energy 1 1 1   Change in appetite 1 2 1   Feeling bad or failure about yourself  0 2 0  Trouble concentrating 0 1 0  Moving slowly or fidgety/restless 0 0 0  Suicidal thoughts 0 0 0  PHQ-9 Score 2 9 3   Difficult doing work/chores  Somewhat difficult       04/14/2022   11:28 AM  GAD 7 : Generalized Anxiety Score  Nervous, Anxious, on Edge 0  Control/stop worrying 0  Worry too  much - different things 1  Trouble relaxing 1  Restless 1  Easily annoyed or irritable 1  Afraid - awful might happen 0  Total GAD 7 Score 4  Anxiety Difficulty Not difficult at all    Assessment/Plan:  Depression, major, single episode, mild (HCC) -Improving -PHQ 9 score 2 this visit.  Was 9 on 03/17/22 -Continue Zoloft 25 mg daily -Continue self-care  - Plan: sertraline (ZOLOFT) 25 MG tablet  Need for influenza vaccination  - Plan: Flu Vaccine QUAD 6+ mos PF IM (Fluarix Quad PF)  Anxiety -GAD7 score 4 this visit -continue zoloft 25 mg daily  Essential hypertension -controlled -continue HCTZ 25 mg daily and losartan 100 mg daily -continue lifestyle modifications  F/u 4-6 months for CPE and f/u.  Grier Mitts, MD

## 2022-04-20 ENCOUNTER — Other Ambulatory Visit (HOSPITAL_COMMUNITY): Payer: Self-pay

## 2022-04-20 ENCOUNTER — Encounter: Payer: Self-pay | Admitting: Family Medicine

## 2022-04-20 ENCOUNTER — Ambulatory Visit (INDEPENDENT_AMBULATORY_CARE_PROVIDER_SITE_OTHER): Payer: No Typology Code available for payment source | Admitting: Family Medicine

## 2022-04-20 VITALS — BP 121/82 | HR 91 | Temp 98.0°F | Ht 68.0 in | Wt 260.8 lb

## 2022-04-20 DIAGNOSIS — J029 Acute pharyngitis, unspecified: Secondary | ICD-10-CM

## 2022-04-20 DIAGNOSIS — J020 Streptococcal pharyngitis: Secondary | ICD-10-CM | POA: Diagnosis not present

## 2022-04-20 LAB — POCT RAPID STREP A (OFFICE): Rapid Strep A Screen: POSITIVE — AB

## 2022-04-20 MED ORDER — AMOXICILLIN 875 MG PO TABS
875.0000 mg | ORAL_TABLET | Freq: Two times a day (BID) | ORAL | 0 refills | Status: DC
  Filled 2022-04-20: qty 14, 7d supply, fill #0

## 2022-04-20 NOTE — Progress Notes (Signed)
   Brad Hart is a 31 y.o. male who presents today for an office visit.  Assessment/Plan:  Strep pharyngitis Rapid strep positive.  Will start amoxicillin.  He has done well with this in the past.  Encouraged hydration.  He can use Tylenol and ibuprofen as needed.  We discussed reasons return to care.  Follow-up as needed.     Subjective:  HPI:  Patient here with sore throat starting yesterday. His wife was recently diagnosed with strep. Some body aches. Some chills. No fever.  Discharge reviewed more orange juice.  Symptoms are stable over the last day or so.  No for chest pain or shortness of breath.       Objective:  Physical Exam: BP 121/82   Pulse 91   Temp 98 F (36.7 C) (Temporal)   Ht 5\' 8"  (1.727 m)   Wt 260 lb 12.8 oz (118.3 kg)   SpO2 96%   BMI 39.65 kg/m   Gen: No acute distress, resting comfortably HEENT: TMs with clear effusion.  OP erythematous with small amount of white appearing exudate at bilateral tonsils CV: Regular rate and rhythm with no murmurs appreciated Pulm: Normal work of breathing, clear to auscultation bilaterally with no crackles, wheezes, or rhonchi Neuro: Grossly normal, moves all extremities Psych: Normal affect and thought content      Katherina Wimer M. , MD 04/20/2022 2:27 PM

## 2022-04-20 NOTE — Patient Instructions (Signed)
It was very nice to see you today!  You have strep.  Please start the amoxicillin.  Let us know if not proving.  Take care, Dr Jimmey Ralph  PLEASE NOTE:  If you had any lab tests, please let us know if you have not heard back within a few days. You may see your results on mychart before we have a chance to review them but we will give you a call once they are reviewed by [place].   If we ordered any referrals today, please let us know if you have not heard from their office within the next week.   If you had any urgent prescriptions sent in today, please check with the pharmacy within an hour of our visit to make sure the prescription was transmitted appropriately.   Please try these tips to maintain a healthy lifestyle:  Eat at least 3 REAL meals and 1-2 snacks per day.  Aim for no more than 5 hours between eating.  If you eat breakfast, please do so within one hour of getting up.   Each meal should contain half fruits/vegetables, one quarter protein, and one quarter carbs (no bigger than a computer mouse)  Cut down on sweet beverages. This includes juice, soda, and sweet tea.   Drink at least 1 glass of water with each meal and aim for at least 8 glasses per day  Exercise at least 150 minutes every week.

## 2022-07-05 ENCOUNTER — Encounter: Payer: 59 | Admitting: Family Medicine

## 2022-07-15 ENCOUNTER — Ambulatory Visit (INDEPENDENT_AMBULATORY_CARE_PROVIDER_SITE_OTHER): Payer: 59 | Admitting: Family Medicine

## 2022-07-15 ENCOUNTER — Encounter: Payer: Self-pay | Admitting: Family Medicine

## 2022-07-15 ENCOUNTER — Other Ambulatory Visit (HOSPITAL_COMMUNITY): Payer: Self-pay

## 2022-07-15 VITALS — BP 120/90 | HR 75 | Temp 98.5°F | Ht 68.31 in | Wt 271.0 lb

## 2022-07-15 DIAGNOSIS — F320 Major depressive disorder, single episode, mild: Secondary | ICD-10-CM

## 2022-07-15 DIAGNOSIS — E559 Vitamin D deficiency, unspecified: Secondary | ICD-10-CM | POA: Diagnosis not present

## 2022-07-15 DIAGNOSIS — Z6841 Body Mass Index (BMI) 40.0 and over, adult: Secondary | ICD-10-CM

## 2022-07-15 DIAGNOSIS — Z0000 Encounter for general adult medical examination without abnormal findings: Secondary | ICD-10-CM | POA: Diagnosis not present

## 2022-07-15 DIAGNOSIS — I10 Essential (primary) hypertension: Secondary | ICD-10-CM | POA: Diagnosis not present

## 2022-07-15 LAB — COMPREHENSIVE METABOLIC PANEL
ALT: 33 U/L (ref 0–53)
AST: 22 U/L (ref 0–37)
Albumin: 4.2 g/dL (ref 3.5–5.2)
Alkaline Phosphatase: 43 U/L (ref 39–117)
BUN: 17 mg/dL (ref 6–23)
CO2: 30 mEq/L (ref 19–32)
Calcium: 9.6 mg/dL (ref 8.4–10.5)
Chloride: 100 mEq/L (ref 96–112)
Creatinine, Ser: 1.17 mg/dL (ref 0.40–1.50)
GFR: 81.91 mL/min (ref >60.00)
Glucose, Bld: 88 mg/dL (ref 70–99)
Potassium: 3.7 mEq/L (ref 3.5–5.1)
Sodium: 138 mEq/L (ref 135–145)
Total Bilirubin: 0.6 mg/dL (ref 0.2–1.2)
Total Protein: 7.5 g/dL (ref 6.0–8.3)

## 2022-07-15 LAB — LIPID PANEL
Cholesterol: 212 mg/dL — ABNORMAL HIGH (ref 0–200)
HDL: 49.3 mg/dL (ref >39.00)
LDL Cholesterol: 130 mg/dL — ABNORMAL HIGH (ref 0–99)
NonHDL: 162.23
Total CHOL/HDL Ratio: 4
Triglycerides: 162 mg/dL — ABNORMAL HIGH (ref 0.0–149.0)
VLDL: 32.4 mg/dL (ref 0.0–40.0)

## 2022-07-15 LAB — TSH: TSH: 1.61 u[IU]/mL (ref 0.35–5.50)

## 2022-07-15 LAB — CBC WITH DIFFERENTIAL/PLATELET
Basophils Absolute: 0.1 10*3/uL (ref 0.0–0.1)
Basophils Relative: 1.1 % (ref 0.0–3.0)
Eosinophils Absolute: 0.2 10*3/uL (ref 0.0–0.7)
Eosinophils Relative: 2.3 % (ref 0.0–5.0)
HCT: 41.4 % (ref 39.0–52.0)
Hemoglobin: 14.5 g/dL (ref 13.0–17.0)
Lymphocytes Relative: 32 % (ref 12.0–46.0)
Lymphs Abs: 2.6 10*3/uL (ref 0.7–4.0)
MCHC: 35.1 g/dL (ref 30.0–36.0)
MCV: 88.2 fl (ref 78.0–100.0)
Monocytes Absolute: 0.6 10*3/uL (ref 0.1–1.0)
Monocytes Relative: 7.7 % (ref 3.0–12.0)
Neutro Abs: 4.6 10*3/uL (ref 1.4–7.7)
Neutrophils Relative %: 56.9 % (ref 43.0–77.0)
Platelets: 275 10*3/uL (ref 150.0–400.0)
RBC: 4.69 Mil/uL (ref 4.22–5.81)
RDW: 13.9 % (ref 11.5–15.5)
WBC: 8.1 10*3/uL (ref 4.0–10.5)

## 2022-07-15 LAB — VITAMIN D 25 HYDROXY (VIT D DEFICIENCY, FRACTURES): VITD: 29.01 ng/mL — ABNORMAL LOW (ref 30.00–100.00)

## 2022-07-15 LAB — HEMOGLOBIN A1C: Hgb A1c MFr Bld: 5.2 % (ref 4.6–6.5)

## 2022-07-15 MED ORDER — HYDROCHLOROTHIAZIDE 25 MG PO TABS
25.0000 mg | ORAL_TABLET | Freq: Every day | ORAL | 3 refills | Status: DC
  Filled 2022-07-15 – 2022-09-30 (×2): qty 90, 90d supply, fill #0
  Filled 2023-01-23: qty 90, 90d supply, fill #1
  Filled 2023-06-13: qty 90, 90d supply, fill #2

## 2022-07-15 MED ORDER — SERTRALINE HCL 25 MG PO TABS
25.0000 mg | ORAL_TABLET | Freq: Every day | ORAL | 2 refills | Status: DC
  Filled 2022-07-15: qty 90, 90d supply, fill #0
  Filled 2022-11-28: qty 90, 90d supply, fill #1
  Filled 2023-04-17: qty 90, 90d supply, fill #2

## 2022-07-15 MED ORDER — LOSARTAN POTASSIUM 100 MG PO TABS
100.0000 mg | ORAL_TABLET | Freq: Every day | ORAL | 3 refills | Status: DC
  Filled 2022-07-15 – 2022-09-30 (×2): qty 90, 90d supply, fill #0
  Filled 2023-01-23: qty 90, 90d supply, fill #1
  Filled 2023-06-13: qty 90, 90d supply, fill #2

## 2022-07-15 NOTE — Patient Instructions (Signed)
Time present to check your blood pressure at home a few times per week to make sure it is not creeping up.  If you notice elevations consistently greater than 140/90 (either number) notify clinic.  Refills were sent in for your current medications.

## 2022-07-15 NOTE — Progress Notes (Signed)
Established Patient Office Visit   Subjective  Patient ID: Brad Hart, male    DOB: 03-27-1991  Age: 32 y.o. MRN: [id number]  Chief Complaint  Patient presents with   Annual Exam    Patient is a 32 year old male with pmh sig for HTN, vitamin D deficiency, depression who was seen for CPE.  Patient is accompanied by his daughter.  Patient states he is doing well overall.  States mood is good.  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  Taking HCTZ 25 mg daily and losartan 100 mg for BP.  Denies headaches.  Endorses recent left shoulder pain that has since resolved.  Does not recall injury.       ROS Negative unless stated above    Objective:     BP (!) 120/90 (BP Location: Left Arm, Patient Position: Sitting, Cuff Size: Normal)   Pulse 75   Temp 98.5 F (36.9 C) (Oral)   Ht 5' 8.31" (1.735 m)   Wt 271 lb (122.9 kg)   SpO2 98%   BMI 40.84 kg/m    Physical Exam Constitutional:      Appearance: Normal appearance.  HENT:     Head: Normocephalic and atraumatic.     Right Ear: Tympanic membrane, ear canal and external ear normal.     Left Ear: Tympanic membrane, ear canal and external ear normal.     Nose: Nose normal.     Mouth/Throat:     Mouth: Mucous membranes are moist.     Pharynx: No oropharyngeal exudate or posterior oropharyngeal erythema.  Eyes:     General: No scleral icterus.    Extraocular Movements: Extraocular movements intact.     Conjunctiva/sclera: Conjunctivae normal.     Pupils: Pupils are equal, round, and reactive to light.  Neck:     Thyroid: No thyromegaly.  Cardiovascular:     Rate and Rhythm: Normal rate and regular rhythm.     Pulses: Normal pulses.     Heart sounds: Normal heart sounds. No murmur heard.    No friction rub.  Pulmonary:     Effort: Pulmonary effort is normal.     Breath sounds: Normal breath sounds. No wheezing, rhonchi or rales.  Abdominal:     General: Bowel sounds  are normal.     Palpations: Abdomen is soft.     Tenderness: There is no abdominal tenderness.  Musculoskeletal:        General: No deformity. Normal range of motion.  Lymphadenopathy:     Cervical: No cervical adenopathy.  Skin:    General: Skin is warm and dry.     Findings: No lesion.  Neurological:     General: No focal deficit present.     Mental Status: He is alert and oriented to person, place, and time.  Psychiatric:        Mood and Affect: Mood normal.        Thought Content: Thought content normal.       07/15/2022    9:11 AM 04/20/2022    2:15 PM 04/14/2022   11:28 AM 03/17/2022   10:02 AM 07/15/2021    1:13 PM  Depression screen PHQ 2/9  Decreased Interest 0 0 0 1 0  Down, Depressed, Hopeless 0 0 0 1 0  PHQ - 2 Score 0 0 0 2 0  Altered sleeping  0 0 1 1  Tired, decreased energy  0 1  1 1  Change in appetite  0 '1 2 1  '$ Feeling bad or failure about yourself   0 0 2 0  Trouble concentrating  0 0 1 0  Moving slowly or fidgety/restless  0 0 0 0  Suicidal thoughts  0 0 0 0  PHQ-9 Score  0 '2 9 3  '$ Difficult doing work/chores  Not difficult at all  Somewhat difficult       07/15/2022    9:11 AM 04/14/2022   11:28 AM  GAD 7 : Generalized Anxiety Score  Nervous, Anxious, on Edge 1 0  Control/stop worrying 0 0  Worry too much - different things 1 1  Trouble relaxing 1 1  Restless 1 1  Easily annoyed or irritable 1 1  Afraid - awful might happen 0 0  Total GAD 7 Score 5 4  Anxiety Difficulty Not difficult at all Not difficult at all       No results found for any visits on 07/15/22.    Assessment & Plan:  Well adult exam -Anticipatory guidance given including wearing seatbelts, smoke detectors in the home, increasing physical activity, increasing p.o. intake of water and vegetables. -labs -Immunizations reviewed -Colonoscopy not indicated 2/2 age -Next CPE in 1 year  Essential hypertension -Elevated -Will check -Continue current medications including  losartan 100 mg daily and HCTZ 25 mg to. -Continue lifestyle modifications -Monitor BP at home.  Adjust medications for readings consistently greater than 140/90. -     Comprehensive metabolic panel -     TSH -     CBC with Differential/Platelet -     hydroCHLOROthiazide; Take 1 tablet (25 mg total) by mouth daily.  Dispense: 90 tablet; Refill: 3 -     Losartan Potassium; Take 1 tablet (100 mg total) by mouth daily.  Dispense: 90 tablet; Refill: 3  Vitamin D deficiency -     VITAMIN D 25 Hydroxy (Vit-D Deficiency, Fractures)  Depression, major, single episode, mild (HCC) -     TSH -     Sertraline HCl; Take 1 tablet (25 mg total) by mouth daily.  Dispense: 90 tablet; Refill: 2  Class 3 severe obesity due to excess calories with serious comorbidity and body mass index (BMI) of 40.0 to 44.9 in adult (HCC) -Body mass index is 40.84 kg/m. -Lifestyle modifications encouraged -     VITAMIN D 25 Hydroxy (Vit-D Deficiency, Fractures) -     Comprehensive metabolic panel -     TSH -     Lipid panel -     Hemoglobin A1c    Return in about 1 year (around 07/15/2023) for Follow-up in 1 year for next CPE.  Otherwise as needed.Billie Ruddy, MD

## 2022-07-16 ENCOUNTER — Other Ambulatory Visit (HOSPITAL_COMMUNITY): Payer: Self-pay

## 2022-07-16 ENCOUNTER — Other Ambulatory Visit: Payer: Self-pay | Admitting: Family Medicine

## 2022-07-16 DIAGNOSIS — E559 Vitamin D deficiency, unspecified: Secondary | ICD-10-CM

## 2022-07-16 MED ORDER — VITAMIN D (ERGOCALCIFEROL) 1.25 MG (50000 UNIT) PO CAPS
50000.0000 [IU] | ORAL_CAPSULE | ORAL | 0 refills | Status: AC
  Filled 2022-07-16 – 2022-09-03 (×2): qty 12, 84d supply, fill #0

## 2022-07-22 ENCOUNTER — Other Ambulatory Visit (HOSPITAL_COMMUNITY): Payer: Self-pay

## 2022-09-03 ENCOUNTER — Other Ambulatory Visit (HOSPITAL_COMMUNITY): Payer: Self-pay

## 2022-10-01 ENCOUNTER — Other Ambulatory Visit (HOSPITAL_COMMUNITY): Payer: Self-pay

## 2022-11-29 ENCOUNTER — Other Ambulatory Visit (HOSPITAL_COMMUNITY): Payer: Self-pay

## 2023-01-26 ENCOUNTER — Other Ambulatory Visit (HOSPITAL_COMMUNITY): Payer: Self-pay

## 2023-04-22 ENCOUNTER — Other Ambulatory Visit (HOSPITAL_COMMUNITY): Payer: Self-pay

## 2023-06-13 ENCOUNTER — Other Ambulatory Visit (HOSPITAL_COMMUNITY): Payer: Self-pay

## 2023-07-16 ENCOUNTER — Other Ambulatory Visit (HOSPITAL_COMMUNITY): Payer: Self-pay

## 2023-07-16 ENCOUNTER — Telehealth: Admitting: Nurse Practitioner

## 2023-07-16 DIAGNOSIS — J020 Streptococcal pharyngitis: Secondary | ICD-10-CM

## 2023-07-16 MED ORDER — AMOXICILLIN 500 MG PO CAPS
500.0000 mg | ORAL_CAPSULE | Freq: Two times a day (BID) | ORAL | 0 refills | Status: AC
  Filled 2023-07-16: qty 20, 10d supply, fill #0

## 2023-07-16 NOTE — Progress Notes (Signed)
 Virtual Visit Consent   [name], you are scheduled for a virtual visit with a [facility] provider today. Just as with appointments in the office, your consent must be obtained to participate. Your consent will be active for this visit and any virtual visit you may have with one of our providers in the next 365 days. If you have a MyChart account, a copy of this consent can be sent to you electronically.  As this is a virtual visit, video technology does not allow for your provider to perform a traditional examination. This may limit your provider's ability to fully assess your condition. If your provider identifies any concerns that need to be evaluated in person or the need to arrange testing (such as labs, EKG, etc.), we will make arrangements to do so. Although advances in technology are sophisticated, we cannot ensure that it will always work on either your end or our end. If the connection with a video visit is poor, the visit may have to be switched to a telephone visit. With either a video or telephone visit, we are not always able to ensure that we have a secure connection.  By engaging in this virtual visit, you consent to the provision of healthcare and authorize for your insurance to be billed (if applicable) for the services provided during this visit. Depending on your insurance coverage, you may receive a charge related to this service.  I need to obtain your verbal consent now. Are you willing to proceed with your visit today? Joshau Code has provided verbal consent on 07/16/2023 for a virtual visit (video or telephone). Claiborne Rigg, NP  Date: 07/16/2023 12:37 PM   Virtual Visit via Video Note   I, Claiborne Rigg, connected with  Brad Hart  ([id number], 08-12-1990) on 07/16/23 at 12:30 PM EDT by a video-enabled telemedicine application and verified that I am speaking with the correct person using two identifiers.  Location: Patient: Virtual Visit Location  Patient: Home Provider: Virtual Visit Location Provider: Home Office   I discussed the limitations of evaluation and management by telemedicine and the availability of in person appointments. The patient expressed understanding and agreed to proceed.    History of Present Illness: Brad Hart is a 33 y.o. who identifies as a male who was assigned male at birth, and is being seen today for sore throat.  Over the past few days Mr. Pine has been experiencing symptoms of sore throat, cough, cervical lymphadenopathy and chills.  He does have a history of confirmed strep pharyngitis in the past and states current symptoms are very similar.  He has been taking ibuprofen with some relief of throat pain.  Denies any nausea or vomiting.  States his wife and daughter are also sick at this time.     Problems:  Patient Active Problem List   Diagnosis Date Noted   Vitamin D deficiency 07/16/2021   Cervicalgia 03/14/2020   Hypertension, essential, benign 02/15/2017   Hypertriglyceridemia 02/15/2017   Morbid obesity with BMI of 40.0-44.9, adult (HCC) 02/15/2017    Allergies: No Known Allergies Medications:  Current Outpatient Medications:    amoxicillin (AMOXIL) 500 MG capsule, Take 1 capsule (500 mg total) by mouth 2 (two) times daily for 10 days., Disp: 20 capsule, Rfl: 0   hydrochlorothiazide (HYDRODIURIL) 25 MG tablet, Take 1 tablet (25 mg total) by mouth daily., Disp: 90 tablet, Rfl: 3   losartan (COZAAR) 100 MG tablet, Take 1 tablet (100 mg total)  by mouth daily., Disp: 90 tablet, Rfl: 3   sertraline (ZOLOFT) 25 MG tablet, Take 1 tablet (25 mg total) by mouth daily., Disp: 90 tablet, Rfl: 2   Vitamin D, Ergocalciferol, (DRISDOL) 1.25 MG (50000 UNIT) CAPS capsule, Take 1 capsule by mouth every 7 days., Disp: 12 capsule, Rfl: 0  Observations/Objective: Patient is well-developed, well-nourished in no acute distress.  Resting comfortably at home.  Head is normocephalic, atraumatic.  No  labored breathing.  Speech is clear and coherent with logical content.  Patient is alert and oriented at baseline.    Assessment and Plan: 1. Strep pharyngitis (Primary) - amoxicillin (AMOXIL) 500 MG capsule; Take 1 capsule (500 mg total) by mouth 2 (two) times daily for 10 days.  Dispense: 20 capsule; Refill: 0  Alternate Tylenol and Motrin every 4-6 hours for throat pain.  May also use warm salt water gargles and honey for sore throat.  Follow Up Instructions: I discussed the assessment and treatment plan with the patient. The patient was provided an opportunity to ask questions and all were answered. The patient agreed with the plan and demonstrated an understanding of the instructions.  A copy of instructions were sent to the patient via MyChart unless otherwise noted below.    The patient was advised to call back or seek an in-person evaluation if the symptoms worsen or if the condition fails to improve as anticipated.    Claiborne Rigg, NP

## 2023-07-16 NOTE — Patient Instructions (Addendum)
  Valetta Close, thank you for joining Claiborne Rigg, NP for today's virtual visit.  While this provider is not your primary care provider (PCP), if your PCP is located in our provider database this encounter information will be shared with them immediately following your visit.   A [facility] MyChart account gives you access to today's visit and all your visits, tests, and labs performed at [facility] " click here if you don't have a [facility] MyChart account or go to [email or web address]  Consent: (Patient) Brad Hart provided verbal consent for this virtual visit at the beginning of the encounter.  Current Medications:  Current Outpatient Medications:    amoxicillin (AMOXIL) 500 MG capsule, Take 1 capsule (500 mg total) by mouth 2 (two) times daily for 10 days., Disp: 20 capsule, Rfl: 0   hydrochlorothiazide (HYDRODIURIL) 25 MG tablet, Take 1 tablet (25 mg total) by mouth daily., Disp: 90 tablet, Rfl: 3   losartan (COZAAR) 100 MG tablet, Take 1 tablet (100 mg total) by mouth daily., Disp: 90 tablet, Rfl: 3   sertraline (ZOLOFT) 25 MG tablet, Take 1 tablet (25 mg total) by mouth daily., Disp: 90 tablet, Rfl: 2   Vitamin D, Ergocalciferol, (DRISDOL) 1.25 MG (50000 UNIT) CAPS capsule, Take 1 capsule by mouth every 7 days., Disp: 12 capsule, Rfl: 0   Medications ordered in this encounter:  Meds ordered this encounter  Medications   amoxicillin (AMOXIL) 500 MG capsule    Sig: Take 1 capsule (500 mg total) by mouth 2 (two) times daily for 10 days.    Dispense:  20 capsule    Refill:  0    Supervising Provider:   Merrilee Jansky [[id number]]     *If you need refills on other medications prior to your next appointment, please contact your pharmacy*  Follow-Up: Call back or seek an in-person evaluation if the symptoms worsen or if the condition fails to improve as anticipated.  [facility] [phone]  Other Instructions  Alternate Tylenol  and Motrin every 4-6 hours for throat pain.  May also use warm salt water gargles and honey for sore throat.   If you have been instructed to have an in-person evaluation today at a local Urgent Care facility, please use the link below. It will take you to a list of all of our available [facility], including address, phone number and hours of operation. Please do not delay care.  [facility]  If you or a family member do not have a primary care provider, use the link below to schedule a visit and establish care. When you choose a [facility] primary care physician or advanced practice provider, you gain a long-term partner in health. Find a Primary Care Provider  Learn more about [facility] in-office and virtual care options: [facility] - Get Care Now

## 2023-09-08 ENCOUNTER — Encounter: Payer: Self-pay | Admitting: Family Medicine

## 2023-09-08 ENCOUNTER — Other Ambulatory Visit (HOSPITAL_COMMUNITY): Payer: Self-pay

## 2023-09-08 ENCOUNTER — Ambulatory Visit (INDEPENDENT_AMBULATORY_CARE_PROVIDER_SITE_OTHER): Admitting: Family Medicine

## 2023-09-08 VITALS — BP 128/90 | HR 95 | Temp 98.7°F | Ht 68.31 in | Wt 276.6 lb

## 2023-09-08 DIAGNOSIS — I10 Essential (primary) hypertension: Secondary | ICD-10-CM

## 2023-09-08 DIAGNOSIS — T148XXA Other injury of unspecified body region, initial encounter: Secondary | ICD-10-CM | POA: Diagnosis not present

## 2023-09-08 DIAGNOSIS — F419 Anxiety disorder, unspecified: Secondary | ICD-10-CM | POA: Diagnosis not present

## 2023-09-08 DIAGNOSIS — F320 Major depressive disorder, single episode, mild: Secondary | ICD-10-CM

## 2023-09-08 DIAGNOSIS — E66813 Obesity, class 3: Secondary | ICD-10-CM

## 2023-09-08 DIAGNOSIS — Z0000 Encounter for general adult medical examination without abnormal findings: Secondary | ICD-10-CM | POA: Diagnosis not present

## 2023-09-08 DIAGNOSIS — Z6841 Body Mass Index (BMI) 40.0 and over, adult: Secondary | ICD-10-CM

## 2023-09-08 DIAGNOSIS — Z1159 Encounter for screening for other viral diseases: Secondary | ICD-10-CM | POA: Diagnosis not present

## 2023-09-08 MED ORDER — HYDROCHLOROTHIAZIDE 25 MG PO TABS
25.0000 mg | ORAL_TABLET | Freq: Every day | ORAL | 3 refills | Status: AC
  Filled 2023-09-08 – 2024-03-01 (×2): qty 90, 90d supply, fill #0

## 2023-09-08 MED ORDER — SERTRALINE HCL 25 MG PO TABS
25.0000 mg | ORAL_TABLET | Freq: Every day | ORAL | 3 refills | Status: AC
Start: 2023-09-08 — End: ?
  Filled 2023-09-08 – 2024-03-01 (×2): qty 90, 90d supply, fill #0

## 2023-09-08 MED ORDER — LOSARTAN POTASSIUM 100 MG PO TABS
100.0000 mg | ORAL_TABLET | Freq: Every day | ORAL | 3 refills | Status: AC
Start: 2023-09-08 — End: 2024-09-07
  Filled 2023-09-08 – 2024-03-01 (×2): qty 90, 90d supply, fill #0

## 2023-09-08 NOTE — Progress Notes (Signed)
 Established Patient Office Visit   Subjective  Patient ID: Brad Hart, male    DOB: 01-31-1991  Age: 33 y.o. MRN: [id number]  Chief Complaint  Patient presents with   Annual Exam    Patient is a 33 year old male seen for CPE.  Pt states he has been doing well.  Staying busy with work.  Requesting refills on HCTZ 25 mg, losartan  100 mg, Zoloft  25 mg.  Patient states blood pressure has been good.  Mood and energy are good.  Patient endorses occasional insomnia.  At times her mind races and is difficult to go to sleep.  Has tried melatonin in the past but afraid it would make him too drowsy in the morning.  Patient was exercising but stopped after developing right-sided upper back/neck pain a week ago.  Patient denies injury.  Tried patches, massage, ibuprofen for symptoms.   Patient Active Problem List   Diagnosis Date Noted   Vitamin D  deficiency 07/16/2021   Cervicalgia 03/14/2020   Hypertension, essential, benign 02/15/2017   Hypertriglyceridemia 02/15/2017   Morbid obesity with BMI of 40.0-44.9, adult (HCC) 02/15/2017   Past Medical History:  Diagnosis Date   Hyperlipidemia    Hypertension    History reviewed. No pertinent surgical history. Social History   Tobacco Use   Smoking status: Former    Current packs/day: 0.00    Types: Cigarettes    Quit date: 09/15/2016    Years since quitting: 6.9   Smokeless tobacco: Never  Substance Use Topics   Alcohol use: Yes    Comment: OCCASIONAL   Drug use: No   Family History  Adopted: Yes  Problem Relation Age of Onset   Hemachromatosis Maternal Grandmother    Heart disease Maternal Grandfather        CAD/MI   No Known Allergies    ROS Negative unless stated above    Objective:    BP (!) 128/90 (BP Location: Left Arm, Patient Position: Sitting, Cuff Size: Normal)   Pulse 95   Temp 98.7 F (37.1 C) (Oral)   Ht 5' 8.31" (1.735 m)   Wt 276 lb 9.6 oz (125.5 kg)   SpO2 97%   BMI 41.68 kg/m  BP Readings from  Last 3 Encounters:  09/08/23 (!) 128/90  07/15/22 (!) 120/90  04/20/22 121/82   Wt Readings from Last 3 Encounters:  09/08/23 276 lb 9.6 oz (125.5 kg)  07/15/22 271 lb (122.9 kg)  04/20/22 260 lb 12.8 oz (118.3 kg)   Physical Exam Constitutional:      Appearance: Normal appearance.  HENT:     Head: Normocephalic and atraumatic.     Right Ear: Tympanic membrane, ear canal and external ear normal.     Left Ear: Tympanic membrane, ear canal and external ear normal.     Nose: Nose normal.     Mouth/Throat:     Mouth: Mucous membranes are moist.     Pharynx: No oropharyngeal exudate or posterior oropharyngeal erythema.  Eyes:     General: No scleral icterus.    Extraocular Movements: Extraocular movements intact.     Conjunctiva/sclera: Conjunctivae normal.     Pupils: Pupils are equal, round, and reactive to light.  Neck:     Thyroid : No thyromegaly.  Cardiovascular:     Rate and Rhythm: Normal rate and regular rhythm.     Pulses: Normal pulses.     Heart sounds: Normal heart sounds. No murmur heard.    No friction rub.  Pulmonary:  Effort: Pulmonary effort is normal.     Breath sounds: Normal breath sounds. No wheezing, rhonchi or rales.  Abdominal:     General: Bowel sounds are normal.     Palpations: Abdomen is soft.     Tenderness: There is no abdominal tenderness.  Musculoskeletal:        General: No deformity. Normal range of motion.  Lymphadenopathy:     Cervical: No cervical adenopathy.  Skin:    General: Skin is warm and dry.     Findings: No lesion.  Neurological:     General: No focal deficit present.     Mental Status: He is alert and oriented to person, place, and time.  Psychiatric:        Mood and Affect: Mood normal.        Thought Content: Thought content normal.      09/08/2023    4:33 PM 07/15/2022    9:11 AM 04/20/2022    2:15 PM  Depression screen PHQ 2/9  Decreased Interest 0 0 0  Down, Depressed, Hopeless 0 0 0  PHQ - 2 Score 0 0 0   Altered sleeping 1  0  Tired, decreased energy 1  0  Change in appetite 1  0  Feeling bad or failure about yourself  0  0  Trouble concentrating 0  0  Moving slowly or fidgety/restless 0  0  Suicidal thoughts 0  0  PHQ-9 Score 3  0  Difficult doing work/chores Not difficult at all  Not difficult at all      09/08/2023    4:33 PM 07/15/2022    9:11 AM 04/14/2022   11:28 AM  GAD 7 : Generalized Anxiety Score  Nervous, Anxious, on Edge 0 1 0  Control/stop worrying 0 0 0  Worry too much - different things 0 1 1  Trouble relaxing 1 1 1   Restless 0 1 1  Easily annoyed or irritable 1 1 1   Afraid - awful might happen 0 0 0  Total GAD 7 Score 2 5 4   Anxiety Difficulty Not difficult at all Not difficult at all Not difficult at all   No results found for any visits on 09/08/23.  Assessment & Plan:  Well adult exam -     CBC with Differential/Platelet; Future -     Comprehensive metabolic panel with GFR; Future -     Hemoglobin A1c; Future -     Lipid panel; Future -     TSH; Future -     T4, free; Future  Essential hypertension -     Comprehensive metabolic panel with GFR; Future -     TSH; Future -     T4, free; Future -     hydroCHLOROthiazide ; Take 1 tablet (25 mg total) by mouth daily.  Dispense: 90 tablet; Refill: 3 -     Losartan  Potassium; Take 1 tablet (100 mg total) by mouth daily.  Dispense: 90 tablet; Refill: 3  Depression, major, single episode, mild (HCC) -     TSH; Future -     T4, free; Future -     Sertraline  HCl; Take 1 tablet (25 mg total) by mouth daily.  Dispense: 90 tablet; Refill: 3  Anxiety -     TSH; Future -     T4, free; Future -     Sertraline  HCl; Take 1 tablet (25 mg total) by mouth daily.  Dispense: 90 tablet; Refill: 3  Class 3 severe obesity with serious  comorbidity and body mass index (BMI) of 40.0 to 44.9 in adult, unspecified obesity type -     Hemoglobin A1c; Future -     Lipid panel; Future -     TSH; Future -     T4, free;  Future  Need for hepatitis C screening test -     Hepatitis C antibody  Muscle strain  Age-appropriate health screenings discussed.  Obtain labs.  Immunizations reviewed.  PHQ-9 score 3, GAD-7 score 2.  Self-care encouraged.  BP elevated at this visit.  Recheck.  Discussed the importance of lifestyle modifications including increasing physical activity, sodium reduction, increasing water intake.  Refills provided for HCTZ 25 mg daily, losartan  100 mg daily, Zoloft  25 mg daily.  Upper back pain likely 2/2 muscle strain.  Pt wishes to continue current management at f/u if needed for continued symptoms.  Return in about 3 months (around 12/09/2023), or if symptoms worsen or fail to improve, for blood pressure.   Viola Greulich, MD

## 2023-09-09 LAB — COMPREHENSIVE METABOLIC PANEL WITH GFR
ALT: 34 U/L (ref 0–53)
AST: 23 U/L (ref 0–37)
Albumin: 4.3 g/dL (ref 3.5–5.2)
Alkaline Phosphatase: 42 U/L (ref 39–117)
BUN: 14 mg/dL (ref 6–23)
CO2: 26 meq/L (ref 19–32)
Calcium: 9.4 mg/dL (ref 8.4–10.5)
Chloride: 102 meq/L (ref 96–112)
Creatinine, Ser: 1.08 mg/dL (ref 0.40–1.50)
GFR: 89.44 mL/min (ref >60.00)
Glucose, Bld: 88 mg/dL (ref 70–99)
Potassium: 3.9 meq/L (ref 3.5–5.1)
Sodium: 138 meq/L (ref 135–145)
Total Bilirubin: 0.4 mg/dL (ref 0.2–1.2)
Total Protein: 7.3 g/dL (ref 6.0–8.3)

## 2023-09-09 LAB — CBC WITH DIFFERENTIAL/PLATELET
Basophils Absolute: 0.1 10*3/uL (ref 0.0–0.1)
Basophils Relative: 1.2 % (ref 0.0–3.0)
Eosinophils Absolute: 0.2 10*3/uL (ref 0.0–0.7)
Eosinophils Relative: 2.2 % (ref 0.0–5.0)
HCT: 40.3 % (ref 39.0–52.0)
Hemoglobin: 14.1 g/dL (ref 13.0–17.0)
Lymphocytes Relative: 28.7 % (ref 12.0–46.0)
Lymphs Abs: 2.3 10*3/uL (ref 0.7–4.0)
MCHC: 34.9 g/dL (ref 30.0–36.0)
MCV: 88.7 fl (ref 78.0–100.0)
Monocytes Absolute: 0.5 10*3/uL (ref 0.1–1.0)
Monocytes Relative: 5.5 % (ref 3.0–12.0)
Neutro Abs: 5.1 10*3/uL (ref 1.4–7.7)
Neutrophils Relative %: 62.4 % (ref 43.0–77.0)
Platelets: 246 10*3/uL (ref 150.0–400.0)
RBC: 4.55 Mil/uL (ref 4.22–5.81)
RDW: 14.1 % (ref 11.5–15.5)
WBC: 8.2 10*3/uL (ref 4.0–10.5)

## 2023-09-09 LAB — T4, FREE: Free T4: 0.68 ng/dL (ref 0.60–1.60)

## 2023-09-09 LAB — HEPATITIS C ANTIBODY: Hepatitis C Ab: NONREACTIVE

## 2023-09-09 LAB — LIPID PANEL
Cholesterol: 196 mg/dL (ref 0–200)
HDL: 51.6 mg/dL (ref >39.00)
LDL Cholesterol: 119 mg/dL — ABNORMAL HIGH (ref 0–99)
NonHDL: 144.19
Total CHOL/HDL Ratio: 4
Triglycerides: 128 mg/dL (ref 0.0–149.0)
VLDL: 25.6 mg/dL (ref 0.0–40.0)

## 2023-09-09 LAB — TSH: TSH: 2.35 u[IU]/mL (ref 0.35–5.50)

## 2023-09-09 LAB — HEMOGLOBIN A1C: Hgb A1c MFr Bld: 5.2 % (ref 4.6–6.5)

## 2023-09-14 ENCOUNTER — Ambulatory Visit: Admitting: Family Medicine

## 2023-09-15 ENCOUNTER — Ambulatory Visit: Payer: Self-pay | Admitting: Family Medicine

## 2023-09-19 ENCOUNTER — Other Ambulatory Visit (HOSPITAL_COMMUNITY): Payer: Self-pay

## 2024-03-01 ENCOUNTER — Other Ambulatory Visit: Payer: Self-pay

## 2024-03-12 ENCOUNTER — Ambulatory Visit: Admitting: Family Medicine

## 2024-03-12 VITALS — BP 140/85 | HR 88 | Temp 98.7°F | Ht 68.0 in | Wt 253.0 lb

## 2024-03-12 DIAGNOSIS — Z720 Tobacco use: Secondary | ICD-10-CM

## 2024-03-12 DIAGNOSIS — E66812 Obesity, class 2: Secondary | ICD-10-CM | POA: Diagnosis not present

## 2024-03-12 DIAGNOSIS — Z6838 Body mass index (BMI) 38.0-38.9, adult: Secondary | ICD-10-CM

## 2024-03-12 DIAGNOSIS — I10 Essential (primary) hypertension: Secondary | ICD-10-CM | POA: Diagnosis not present

## 2024-03-12 DIAGNOSIS — Z23 Encounter for immunization: Secondary | ICD-10-CM | POA: Diagnosis not present

## 2024-03-12 NOTE — Addendum Note (Signed)
 Addended by: BEVELY CURTISTINE PARAS on: 03/12/2024 10:21 AM   Modules accepted: Orders

## 2024-03-12 NOTE — Progress Notes (Signed)
 Established Patient Office Visit   Subjective  Patient ID: Brad Hart, male    DOB: February 06, 1991  Age: 33 y.o. MRN: [id number]  Chief Complaint  Patient presents with   Follow-up    Patient states he has been feeling fine, states nothing is worst. No other concerns outside of blood pressure.     Pt is a 33 yo male seen for f/u on HTN.  Pt states occasionally checking bp at home.  May have readings similar to last OFV, 120s/90.  Denies HAs, CP, changes in vision, LE edema.  Taking hydrochlorothiazide  25 mg and losartan  100 mg daily.  Sleep is ok, but notes snoring more in the last few wks.  Feels unrested.  Tired during the day and taking naps.  Working on diet and exercise.  Wt down to 253 lbs, was 277 lb on 09/08/23.  Pt uses a disposable vape daily that contains nicotine.  Also drinking more coffee than he typically does due to school.      Patient Active Problem List   Diagnosis Date Noted   Vitamin D  deficiency 07/16/2021   Cervicalgia 03/14/2020   Hypertension, essential, benign 02/15/2017   Hypertriglyceridemia 02/15/2017   Morbid obesity with BMI of 40.0-44.9, adult (HCC) 02/15/2017   Past Medical History:  Diagnosis Date   Hyperlipidemia    Hypertension    No past surgical history on file. Social History   Tobacco Use   Smoking status: Former    Current packs/day: 0.00    Types: Cigarettes    Quit date: 09/15/2016    Years since quitting: 7.4   Smokeless tobacco: Never  Substance Use Topics   Alcohol use: Yes    Comment: OCCASIONAL   Drug use: No   Family History  Adopted: Yes  Problem Relation Age of Onset   Hemachromatosis Maternal Grandmother    Heart disease Maternal Grandfather        CAD/MI   No Known Allergies  ROS Negative unless stated above    Objective:     BP (!) 140/85 (BP Location: Right Arm, Patient Position: Sitting, Cuff Size: Normal)   Pulse 88   Temp 98.7 F (37.1 C) (Oral)   Ht 5' 8 (1.727 m)   Wt 253 lb (114.8 kg)    SpO2 97%   BMI 38.47 kg/m  BP Readings from Last 3 Encounters:  03/12/24 (!) 140/85  09/08/23 (!) 128/90  07/15/22 (!) 120/90   Wt Readings from Last 3 Encounters:  03/12/24 253 lb (114.8 kg)  09/08/23 276 lb 9.6 oz (125.5 kg)  07/15/22 271 lb (122.9 kg)      Physical Exam Constitutional:      General: He is not in acute distress.    Appearance: Normal appearance.  HENT:     Head: Normocephalic and atraumatic.     Nose: Nose normal.     Mouth/Throat:     Mouth: Mucous membranes are moist.  Cardiovascular:     Rate and Rhythm: Normal rate and regular rhythm.     Heart sounds: Normal heart sounds. No murmur heard.    No gallop.  Pulmonary:     Effort: Pulmonary effort is normal. No respiratory distress.     Breath sounds: Normal breath sounds. No wheezing, rhonchi or rales.  Skin:    General: Skin is warm and dry.  Neurological:     Mental Status: He is alert and oriented to person, place, and time.        03/12/2024  9:27 AM 09/08/2023    4:33 PM 07/15/2022    9:11 AM  Depression screen PHQ 2/9  Decreased Interest 0 0 0  Down, Depressed, Hopeless 0 0 0  PHQ - 2 Score 0 0 0  Altered sleeping  1   Tired, decreased energy  1   Change in appetite  1   Feeling bad or failure about yourself   0   Trouble concentrating  0   Moving slowly or fidgety/restless  0   Suicidal thoughts  0   PHQ-9 Score  3    Difficult doing work/chores  Not difficult at all      Data saved with a previous flowsheet row definition      03/12/2024    9:27 AM 09/08/2023    4:33 PM 07/15/2022    9:11 AM 04/14/2022   11:28 AM  GAD 7 : Generalized Anxiety Score  Nervous, Anxious, on Edge 0 0 1 0  Control/stop worrying 0 0 0 0  Worry too much - different things 0 0 1 1  Trouble relaxing 0 1 1 1   Restless 0 0 1 1  Easily annoyed or irritable 0 1 1 1   Afraid - awful might happen 0 0 0 0  Total GAD 7 Score 0 2 5 4   Anxiety Difficulty Not difficult at all Not difficult at all Not  difficult at all Not difficult at all     No results found for any visits on 03/12/24.    Assessment & Plan:   Essential hypertension -     Pulmonary Visit  Vapes nicotine containing substance  Class 2 severe obesity with serious comorbidity and body mass index (BMI) of 38.0 to 38.9 in adult, unspecified obesity type  BP elevated this visit.  Slight improvement on recheck.  Continue losartan  100 mg daily and hydrochlorothiazide  25 mg daily.  Advised to cut down on vaping and caffeine consumption as may be contributing to bp elevation.  Smoking cessation counseling >3, less 10 min.  Continue monitoring bp at home and bring log of readings to visit.  Continue other lifestyle modifications.  Obtain sleep study. Congratulated on wt loss, now 253 lbs, was 277 lb on 09/08/23.  Return in about 4 months (around 07/10/2024).   Clotilda JONELLE Single, MD

## 2024-04-30 ENCOUNTER — Encounter (HOSPITAL_BASED_OUTPATIENT_CLINIC_OR_DEPARTMENT_OTHER): Payer: Self-pay

## 2024-04-30 ENCOUNTER — Ambulatory Visit (HOSPITAL_BASED_OUTPATIENT_CLINIC_OR_DEPARTMENT_OTHER)

## 2024-07-11 ENCOUNTER — Ambulatory Visit: Admitting: Family Medicine
